# Patient Record
Sex: Female | Born: 1945 | Race: White | Hispanic: No | Marital: Married | Smoking: Current every day smoker
Health system: Southern US, Community
[De-identification: ages and names within clinical notes are randomized; demographics above are authoritative.]

## PROBLEM LIST (undated history)

## (undated) DIAGNOSIS — R569 Unspecified convulsions: Secondary | ICD-10-CM

## (undated) DIAGNOSIS — I252 Old myocardial infarction: Secondary | ICD-10-CM

## (undated) DIAGNOSIS — I10 Essential (primary) hypertension: Secondary | ICD-10-CM

## (undated) DIAGNOSIS — F329 Major depressive disorder, single episode, unspecified: Secondary | ICD-10-CM

## (undated) DIAGNOSIS — I639 Cerebral infarction, unspecified: Secondary | ICD-10-CM

## (undated) DIAGNOSIS — E119 Type 2 diabetes mellitus without complications: Secondary | ICD-10-CM

## (undated) DIAGNOSIS — F32A Depression, unspecified: Secondary | ICD-10-CM

## (undated) DIAGNOSIS — F419 Anxiety disorder, unspecified: Secondary | ICD-10-CM

## (undated) HISTORY — PX: RECTAL SURGERY: SHX760

## (undated) HISTORY — PX: ABDOMINAL SURGERY: SHX537

## (undated) HISTORY — PX: CARDIAC SURGERY: SHX584

## (undated) HISTORY — PX: BLADDER SURGERY: SHX569

## (undated) HISTORY — PX: ABDOMINAL HYSTERECTOMY: SHX81

---

## 2003-07-12 ENCOUNTER — Ambulatory Visit (HOSPITAL_COMMUNITY): Admission: RE | Admit: 2003-07-12 | Discharge: 2003-07-12 | Payer: Self-pay

## 2003-08-11 ENCOUNTER — Inpatient Hospital Stay (HOSPITAL_COMMUNITY): Admission: EM | Admit: 2003-08-11 | Discharge: 2003-08-12 | Payer: Self-pay | Admitting: Psychiatry

## 2003-08-12 ENCOUNTER — Inpatient Hospital Stay (HOSPITAL_COMMUNITY): Admission: EM | Admit: 2003-08-12 | Discharge: 2003-08-13 | Payer: Self-pay | Admitting: Emergency Medicine

## 2003-08-13 ENCOUNTER — Inpatient Hospital Stay (HOSPITAL_COMMUNITY): Admission: EM | Admit: 2003-08-13 | Discharge: 2003-08-18 | Payer: Self-pay | Admitting: Psychiatry

## 2005-05-27 ENCOUNTER — Emergency Department: Payer: Self-pay | Admitting: Emergency Medicine

## 2005-05-27 ENCOUNTER — Other Ambulatory Visit: Payer: Self-pay

## 2007-08-09 ENCOUNTER — Inpatient Hospital Stay: Payer: Self-pay | Admitting: Internal Medicine

## 2007-08-09 ENCOUNTER — Other Ambulatory Visit: Payer: Self-pay

## 2007-09-11 ENCOUNTER — Other Ambulatory Visit: Payer: Self-pay

## 2007-09-11 ENCOUNTER — Inpatient Hospital Stay: Payer: Self-pay | Admitting: Internal Medicine

## 2007-10-01 ENCOUNTER — Other Ambulatory Visit: Payer: Self-pay

## 2007-10-01 ENCOUNTER — Emergency Department: Payer: Self-pay | Admitting: Emergency Medicine

## 2007-10-05 ENCOUNTER — Other Ambulatory Visit: Payer: Self-pay

## 2007-10-05 ENCOUNTER — Emergency Department: Payer: Self-pay | Admitting: Emergency Medicine

## 2007-10-27 ENCOUNTER — Other Ambulatory Visit: Payer: Self-pay

## 2007-10-27 ENCOUNTER — Emergency Department: Payer: Self-pay | Admitting: Emergency Medicine

## 2008-01-27 ENCOUNTER — Inpatient Hospital Stay: Payer: Self-pay | Admitting: Internal Medicine

## 2008-01-27 ENCOUNTER — Other Ambulatory Visit: Payer: Self-pay

## 2008-04-24 ENCOUNTER — Other Ambulatory Visit: Payer: Self-pay

## 2008-04-24 ENCOUNTER — Inpatient Hospital Stay: Payer: Self-pay | Admitting: Internal Medicine

## 2010-12-21 ENCOUNTER — Inpatient Hospital Stay: Payer: Self-pay | Admitting: *Deleted

## 2012-01-12 LAB — COMPREHENSIVE METABOLIC PANEL
Albumin: 3.7 g/dL (ref 3.4–5.0)
Alkaline Phosphatase: 116 U/L (ref 50–136)
Anion Gap: 11 (ref 7–16)
BUN: 15 mg/dL (ref 7–18)
Bilirubin,Total: 0.2 mg/dL (ref 0.2–1.0)
Calcium, Total: 9.1 mg/dL (ref 8.5–10.1)
Co2: 26 mmol/L (ref 21–32)
Creatinine: 0.82 mg/dL (ref 0.60–1.30)
EGFR (African American): >60
EGFR (Non-African Amer.): >60
Glucose: 73 mg/dL (ref 65–99)
Osmolality: 277 (ref 275–301)
Sodium: 139 mmol/L (ref 136–145)
Total Protein: 7.5 g/dL (ref 6.4–8.2)

## 2012-01-12 LAB — CBC
HCT: 40.4 % (ref 35.0–47.0)
MCH: 32 pg (ref 26.0–34.0)
MCHC: 33.3 g/dL (ref 32.0–36.0)
MCV: 96 fL (ref 80–100)
Platelet: 276 10*3/uL (ref 150–440)
RBC: 4.2 10*6/uL (ref 3.80–5.20)
WBC: 8.4 10*3/uL (ref 3.6–11.0)

## 2012-01-12 LAB — CK TOTAL AND CKMB (NOT AT ARMC)
CK, Total: 70 U/L (ref 21–215)
CK-MB: 0.7 ng/mL (ref 0.5–3.6)

## 2012-01-12 LAB — PROTIME-INR
INR: 0.8
Prothrombin Time: 11.4 secs — ABNORMAL LOW (ref 11.5–14.7)

## 2012-01-13 ENCOUNTER — Observation Stay: Payer: Self-pay | Admitting: Internal Medicine

## 2012-01-13 LAB — TROPONIN I
Troponin-I: <0.02 ng/mL
Troponin-I: <0.02 ng/mL

## 2012-01-13 LAB — CK TOTAL AND CKMB (NOT AT ARMC)
CK, Total: 53 U/L (ref 21–215)
CK-MB: 0.8 ng/mL (ref 0.5–3.6)
CK-MB: 1 ng/mL (ref 0.5–3.6)

## 2012-01-13 LAB — LIPID PANEL
HDL Cholesterol: 53 mg/dL (ref 40–60)
Triglycerides: 126 mg/dL (ref 0–200)
VLDL Cholesterol, Calc: 25 mg/dL (ref 5–40)

## 2012-01-13 LAB — MAGNESIUM: Magnesium: 1.3 mg/dL — ABNORMAL LOW

## 2012-01-13 LAB — TSH: Thyroid Stimulating Horm: 3.4 u[IU]/mL

## 2012-01-19 ENCOUNTER — Emergency Department: Payer: Self-pay | Admitting: *Deleted

## 2012-06-09 ENCOUNTER — Inpatient Hospital Stay: Payer: Self-pay | Admitting: Specialist

## 2012-06-09 LAB — CBC
HCT: 41 % (ref 35.0–47.0)
HGB: 13.6 g/dL (ref 12.0–16.0)
MCH: 31.5 pg (ref 26.0–34.0)
RBC: 4.33 10*6/uL (ref 3.80–5.20)

## 2012-06-09 LAB — BASIC METABOLIC PANEL
Anion Gap: 8 (ref 7–16)
Creatinine: 0.78 mg/dL (ref 0.60–1.30)
EGFR (African American): >60
EGFR (Non-African Amer.): >60

## 2012-06-09 LAB — CK TOTAL AND CKMB (NOT AT ARMC): CK, Total: 74 U/L (ref 21–215)

## 2012-06-10 LAB — URINALYSIS, COMPLETE
Glucose,UR: NEGATIVE mg/dL (ref 0–75)
Leukocyte Esterase: NEGATIVE
Nitrite: NEGATIVE
Protein: NEGATIVE
RBC,UR: <1 /HPF (ref 0–5)
Squamous Epithelial: NONE SEEN

## 2012-06-10 LAB — CBC WITH DIFFERENTIAL/PLATELET
Basophil #: 0.1 10*3/uL (ref 0.0–0.1)
Eosinophil %: 1.6 %
HCT: 37.3 % (ref 35.0–47.0)
MCH: 32.2 pg (ref 26.0–34.0)
MCV: 95 fL (ref 80–100)
Monocyte %: 5.6 %
Platelet: 252 10*3/uL (ref 150–440)
RDW: 14.1 % (ref 11.5–14.5)
WBC: 8.6 10*3/uL (ref 3.6–11.0)

## 2012-06-10 LAB — LIPID PANEL
Cholesterol: 197 mg/dL (ref 0–200)
HDL Cholesterol: 47 mg/dL (ref 40–60)
Ldl Cholesterol, Calc: 123 mg/dL — ABNORMAL HIGH (ref 0–100)
Triglycerides: 136 mg/dL (ref 0–200)
VLDL Cholesterol, Calc: 27 mg/dL (ref 5–40)

## 2012-06-10 LAB — TROPONIN I: Troponin-I: <0.02 ng/mL

## 2012-10-10 ENCOUNTER — Emergency Department: Payer: Self-pay

## 2012-10-10 ENCOUNTER — Emergency Department: Payer: Self-pay | Admitting: Emergency Medicine

## 2012-10-10 LAB — URINALYSIS, COMPLETE
Blood: NEGATIVE
Glucose,UR: NEGATIVE mg/dL (ref 0–75)
Nitrite: NEGATIVE
Ph: 6 (ref 4.5–8.0)
Squamous Epithelial: <1

## 2012-10-10 LAB — TROPONIN I: Troponin-I: <0.02 ng/mL

## 2012-10-10 LAB — COMPREHENSIVE METABOLIC PANEL
Albumin: 3.5 g/dL (ref 3.4–5.0)
Bilirubin,Total: 0.1 mg/dL — ABNORMAL LOW (ref 0.2–1.0)
Calcium, Total: 8.7 mg/dL (ref 8.5–10.1)
Co2: 27 mmol/L (ref 21–32)
Creatinine: 0.82 mg/dL (ref 0.60–1.30)
EGFR (African American): >60
EGFR (Non-African Amer.): >60
Osmolality: 276 (ref 275–301)
SGOT(AST): 10 U/L — ABNORMAL LOW (ref 15–37)
Sodium: 138 mmol/L (ref 136–145)

## 2012-10-10 LAB — CBC WITH DIFFERENTIAL/PLATELET
Basophil #: 0.1 10*3/uL (ref 0.0–0.1)
Eosinophil #: 0.1 10*3/uL (ref 0.0–0.7)
Eosinophil %: 1.5 %
HCT: 41.3 % (ref 35.0–47.0)
Lymphocyte #: 3.1 10*3/uL (ref 1.0–3.6)
Lymphocyte %: 38 %
MCH: 30.8 pg (ref 26.0–34.0)
MCV: 95 fL (ref 80–100)
Monocyte #: 0.5 x10 3/mm (ref 0.2–0.9)
Monocyte %: 6.1 %
Neutrophil #: 4.4 10*3/uL (ref 1.4–6.5)
Neutrophil %: 53.7 %
Platelet: 311 10*3/uL (ref 150–440)
RBC: 4.33 10*6/uL (ref 3.80–5.20)
RDW: 14 % (ref 11.5–14.5)
WBC: 8.2 10*3/uL (ref 3.6–11.0)

## 2012-12-20 ENCOUNTER — Observation Stay: Payer: Self-pay | Admitting: Internal Medicine

## 2012-12-20 LAB — CBC
HCT: 37.8 % (ref 35.0–47.0)
HGB: 12.6 g/dL (ref 12.0–16.0)
MCV: 93 fL (ref 80–100)
Platelet: 294 10*3/uL (ref 150–440)
RDW: 14.3 % (ref 11.5–14.5)
WBC: 8.7 10*3/uL (ref 3.6–11.0)

## 2012-12-20 LAB — COMPREHENSIVE METABOLIC PANEL
Alkaline Phosphatase: 131 U/L (ref 50–136)
Anion Gap: 7 (ref 7–16)
Co2: 27 mmol/L (ref 21–32)
Glucose: 124 mg/dL — ABNORMAL HIGH (ref 65–99)
Osmolality: 277 (ref 275–301)
SGPT (ALT): 12 U/L (ref 12–78)
Sodium: 138 mmol/L (ref 136–145)
Total Protein: 6.9 g/dL (ref 6.4–8.2)

## 2012-12-20 LAB — URINALYSIS, COMPLETE
Blood: NEGATIVE
Glucose,UR: NEGATIVE mg/dL (ref 0–75)
Ketone: NEGATIVE
Leukocyte Esterase: NEGATIVE
Ph: 7 (ref 4.5–8.0)
Protein: NEGATIVE
RBC,UR: 2 /HPF (ref 0–5)
WBC UR: 4 /HPF (ref 0–5)

## 2012-12-20 LAB — TROPONIN I: Troponin-I: <0.02 ng/mL

## 2012-12-20 LAB — PROTIME-INR: Prothrombin Time: 11.8 secs (ref 11.5–14.7)

## 2012-12-21 DIAGNOSIS — G459 Transient cerebral ischemic attack, unspecified: Secondary | ICD-10-CM

## 2012-12-21 LAB — LIPID PANEL
Cholesterol: 237 mg/dL — ABNORMAL HIGH (ref 0–200)
Ldl Cholesterol, Calc: 142 mg/dL — ABNORMAL HIGH (ref 0–100)
VLDL Cholesterol, Calc: 42 mg/dL — ABNORMAL HIGH (ref 5–40)

## 2012-12-22 LAB — HEMOGLOBIN A1C: Hemoglobin A1C: 8 % — ABNORMAL HIGH (ref 4.2–6.3)

## 2013-01-10 ENCOUNTER — Observation Stay: Payer: Self-pay | Admitting: Specialist

## 2013-01-10 LAB — COMPREHENSIVE METABOLIC PANEL
Albumin: 3.4 g/dL (ref 3.4–5.0)
Alkaline Phosphatase: 122 U/L (ref 50–136)
Anion Gap: 9 (ref 7–16)
BUN: 14 mg/dL (ref 7–18)
Bilirubin,Total: 0.1 mg/dL — ABNORMAL LOW (ref 0.2–1.0)
Calcium, Total: 8.2 mg/dL — ABNORMAL LOW (ref 8.5–10.1)
Chloride: 105 mmol/L (ref 98–107)
Co2: 23 mmol/L (ref 21–32)
EGFR (African American): >60
EGFR (Non-African Amer.): >60
Glucose: 153 mg/dL — ABNORMAL HIGH (ref 65–99)
Osmolality: 277 (ref 275–301)
Potassium: 3.9 mmol/L (ref 3.5–5.1)
SGPT (ALT): 11 U/L — ABNORMAL LOW (ref 12–78)
Sodium: 137 mmol/L (ref 136–145)
Total Protein: 6.8 g/dL (ref 6.4–8.2)

## 2013-01-10 LAB — CBC
HCT: 36.4 % (ref 35.0–47.0)
HGB: 12.6 g/dL (ref 12.0–16.0)
MCH: 32.7 pg (ref 26.0–34.0)
MCHC: 34.6 g/dL (ref 32.0–36.0)
RBC: 3.85 10*6/uL (ref 3.80–5.20)
RDW: 14 % (ref 11.5–14.5)
WBC: 9.6 10*3/uL (ref 3.6–11.0)

## 2013-01-10 LAB — TROPONIN I: Troponin-I: <0.02 ng/mL

## 2013-01-10 LAB — PROTIME-INR
INR: 0.9
Prothrombin Time: 12.1 secs (ref 11.5–14.7)

## 2013-01-10 LAB — CK TOTAL AND CKMB (NOT AT ARMC): CK, Total: 84 U/L (ref 21–215)

## 2013-01-11 LAB — BASIC METABOLIC PANEL
BUN: 15 mg/dL (ref 7–18)
Calcium, Total: 8.1 mg/dL — ABNORMAL LOW (ref 8.5–10.1)
Chloride: 105 mmol/L (ref 98–107)
Co2: 29 mmol/L (ref 21–32)
Creatinine: 0.92 mg/dL (ref 0.60–1.30)
EGFR (African American): >60
EGFR (Non-African Amer.): >60
Osmolality: 282 (ref 275–301)
Sodium: 141 mmol/L (ref 136–145)

## 2013-01-11 LAB — CBC WITH DIFFERENTIAL/PLATELET
Basophil #: 0.1 10*3/uL (ref 0.0–0.1)
Basophil %: 0.7 %
Eosinophil #: 0.2 10*3/uL (ref 0.0–0.7)
Eosinophil %: 2.6 %
HCT: 36.4 % (ref 35.0–47.0)
HGB: 12.4 g/dL (ref 12.0–16.0)
Lymphocyte #: 4.2 10*3/uL — ABNORMAL HIGH (ref 1.0–3.6)
Lymphocyte %: 50.6 %
MCH: 32.3 pg (ref 26.0–34.0)
MCHC: 33.9 g/dL (ref 32.0–36.0)
Monocyte %: 8.2 %
Neutrophil #: 3.1 10*3/uL (ref 1.4–6.5)
Neutrophil %: 37.9 %
Platelet: 286 10*3/uL (ref 150–440)
RBC: 3.82 10*6/uL (ref 3.80–5.20)

## 2013-01-11 LAB — URINALYSIS, COMPLETE
Bilirubin,UR: NEGATIVE
Ketone: NEGATIVE
Leukocyte Esterase: NEGATIVE
Nitrite: NEGATIVE
Ph: 5 (ref 4.5–8.0)
Protein: NEGATIVE
RBC,UR: 1 /HPF (ref 0–5)
Squamous Epithelial: <1
WBC UR: <1 /HPF (ref 0–5)

## 2013-01-11 LAB — CK TOTAL AND CKMB (NOT AT ARMC): CK-MB: 1.3 ng/mL (ref 0.5–3.6)

## 2013-01-11 LAB — TROPONIN I
Troponin-I: <0.02 ng/mL
Troponin-I: <0.02 ng/mL

## 2013-02-19 ENCOUNTER — Emergency Department: Payer: Self-pay | Admitting: Emergency Medicine

## 2013-02-19 LAB — COMPREHENSIVE METABOLIC PANEL
Albumin: 3.6 g/dL (ref 3.4–5.0)
Alkaline Phosphatase: 158 U/L — ABNORMAL HIGH (ref 50–136)
BUN: 10 mg/dL (ref 7–18)
Bilirubin,Total: 0.3 mg/dL (ref 0.2–1.0)
Co2: 27 mmol/L (ref 21–32)
Creatinine: 0.65 mg/dL (ref 0.60–1.30)
EGFR (African American): >60
EGFR (Non-African Amer.): >60
Osmolality: 273 (ref 275–301)
SGOT(AST): 29 U/L (ref 15–37)
Sodium: 137 mmol/L (ref 136–145)
Total Protein: 7.9 g/dL (ref 6.4–8.2)

## 2013-02-19 LAB — URINALYSIS, COMPLETE
Bilirubin,UR: NEGATIVE
Glucose,UR: NEGATIVE mg/dL (ref 0–75)
Ketone: NEGATIVE
WBC UR: 12 /HPF (ref 0–5)

## 2013-02-19 LAB — CBC
MCHC: 33.8 g/dL (ref 32.0–36.0)
MCV: 94 fL (ref 80–100)
RBC: 4.37 10*6/uL (ref 3.80–5.20)
RDW: 14.6 % — ABNORMAL HIGH (ref 11.5–14.5)
WBC: 8.8 10*3/uL (ref 3.6–11.0)

## 2013-02-19 LAB — TROPONIN I: Troponin-I: <0.02 ng/mL

## 2013-02-21 LAB — URINE CULTURE

## 2013-04-07 ENCOUNTER — Emergency Department: Payer: Self-pay | Admitting: Emergency Medicine

## 2013-04-07 LAB — BASIC METABOLIC PANEL
Anion Gap: 8 (ref 7–16)
BUN: 15 mg/dL (ref 7–18)
Calcium, Total: 9.2 mg/dL (ref 8.5–10.1)
Chloride: 102 mmol/L (ref 98–107)
Creatinine: 0.98 mg/dL (ref 0.60–1.30)
EGFR (African American): >60
EGFR (Non-African Amer.): 59 — ABNORMAL LOW
Glucose: 145 mg/dL — ABNORMAL HIGH (ref 65–99)
Potassium: 4.7 mmol/L (ref 3.5–5.1)

## 2013-04-07 LAB — CBC
HCT: 42.7 % (ref 35.0–47.0)
MCH: 31.9 pg (ref 26.0–34.0)
MCHC: 34 g/dL (ref 32.0–36.0)
MCV: 94 fL (ref 80–100)
Platelet: 269 10*3/uL (ref 150–440)
WBC: 8.1 10*3/uL (ref 3.6–11.0)

## 2013-04-07 LAB — TROPONIN I
Troponin-I: <0.02 ng/mL
Troponin-I: <0.02 ng/mL

## 2013-04-07 LAB — CK TOTAL AND CKMB (NOT AT ARMC): CK-MB: 0.7 ng/mL (ref 0.5–3.6)

## 2013-04-23 LAB — CK TOTAL AND CKMB (NOT AT ARMC)
CK, Total: 47 U/L (ref 21–215)
CK-MB: 0.8 ng/mL (ref 0.5–3.6)
CK-MB: 1.1 ng/mL (ref 0.5–3.6)

## 2013-04-23 LAB — URINALYSIS, COMPLETE
Bilirubin,UR: NEGATIVE
Glucose,UR: NEGATIVE mg/dL (ref 0–75)
Ketone: NEGATIVE
Ph: 7 (ref 4.5–8.0)
Protein: NEGATIVE
Specific Gravity: 1.006 (ref 1.003–1.030)
Squamous Epithelial: <1

## 2013-04-23 LAB — BASIC METABOLIC PANEL
Anion Gap: 7 (ref 7–16)
Chloride: 104 mmol/L (ref 98–107)
Co2: 26 mmol/L (ref 21–32)
Creatinine: 0.87 mg/dL (ref 0.60–1.30)
EGFR (African American): >60
Potassium: 4.4 mmol/L (ref 3.5–5.1)
Sodium: 137 mmol/L (ref 136–145)

## 2013-04-23 LAB — CBC
HCT: 39.7 % (ref 35.0–47.0)
HGB: 13.4 g/dL (ref 12.0–16.0)
MCH: 31.5 pg (ref 26.0–34.0)
MCHC: 33.8 g/dL (ref 32.0–36.0)
Platelet: 272 10*3/uL (ref 150–440)
RBC: 4.25 10*6/uL (ref 3.80–5.20)

## 2013-04-23 LAB — TROPONIN I
Troponin-I: <0.02 ng/mL
Troponin-I: <0.02 ng/mL

## 2013-04-24 LAB — CBC WITH DIFFERENTIAL/PLATELET
Basophil #: 0.1 10*3/uL (ref 0.0–0.1)
Basophil %: 0.9 %
Eosinophil #: 0.1 10*3/uL (ref 0.0–0.7)
HCT: 37.4 % (ref 35.0–47.0)
Lymphocyte #: 2.9 10*3/uL (ref 1.0–3.6)
MCH: 31.5 pg (ref 26.0–34.0)
MCHC: 33.1 g/dL (ref 32.0–36.0)
Monocyte #: 1.1 x10 3/mm — ABNORMAL HIGH (ref 0.2–0.9)
Monocyte %: 8.1 %
Neutrophil #: 9.1 10*3/uL — ABNORMAL HIGH (ref 1.4–6.5)
Neutrophil %: 68.1 %
Platelet: 291 10*3/uL (ref 150–440)
RBC: 3.94 10*6/uL (ref 3.80–5.20)
WBC: 13.4 10*3/uL — ABNORMAL HIGH (ref 3.6–11.0)

## 2013-04-24 LAB — BASIC METABOLIC PANEL
Anion Gap: 6 — ABNORMAL LOW (ref 7–16)
BUN: 20 mg/dL — ABNORMAL HIGH (ref 7–18)
Calcium, Total: 8.4 mg/dL — ABNORMAL LOW (ref 8.5–10.1)
Chloride: 102 mmol/L (ref 98–107)
Co2: 28 mmol/L (ref 21–32)
EGFR (African American): 48 — ABNORMAL LOW
EGFR (Non-African Amer.): 42 — ABNORMAL LOW
Glucose: 164 mg/dL — ABNORMAL HIGH (ref 65–99)
Osmolality: 278 (ref 275–301)
Sodium: 136 mmol/L (ref 136–145)

## 2013-04-24 LAB — CK TOTAL AND CKMB (NOT AT ARMC)
CK, Total: 43 U/L (ref 21–215)
CK-MB: 0.9 ng/mL (ref 0.5–3.6)

## 2013-04-24 LAB — TROPONIN I: Troponin-I: <0.02 ng/mL

## 2013-04-24 LAB — LIPID PANEL
HDL Cholesterol: 43 mg/dL (ref 40–60)
VLDL Cholesterol, Calc: 46 mg/dL — ABNORMAL HIGH (ref 5–40)

## 2013-04-25 ENCOUNTER — Inpatient Hospital Stay: Payer: Self-pay | Admitting: Student

## 2013-04-26 LAB — CBC WITH DIFFERENTIAL/PLATELET
Basophil #: 0.1 10*3/uL (ref 0.0–0.1)
Basophil %: 0.7 %
Eosinophil #: 0.2 10*3/uL (ref 0.0–0.7)
Eosinophil %: 1.8 %
HCT: 38.4 % (ref 35.0–47.0)
HGB: 13 g/dL (ref 12.0–16.0)
Lymphocyte #: 2.5 10*3/uL (ref 1.0–3.6)
MCH: 31.5 pg (ref 26.0–34.0)
MCHC: 33.8 g/dL (ref 32.0–36.0)
MCV: 93 fL (ref 80–100)
Neutrophil #: 6.3 10*3/uL (ref 1.4–6.5)
Neutrophil %: 64.1 %
Platelet: 249 10*3/uL (ref 150–440)
RBC: 4.12 10*6/uL (ref 3.80–5.20)
RDW: 14.1 % (ref 11.5–14.5)
WBC: 9.9 10*3/uL (ref 3.6–11.0)

## 2013-04-26 LAB — URINE CULTURE

## 2013-05-06 ENCOUNTER — Emergency Department: Payer: Self-pay | Admitting: Emergency Medicine

## 2013-05-06 LAB — COMPREHENSIVE METABOLIC PANEL
Alkaline Phosphatase: 140 U/L — ABNORMAL HIGH (ref 50–136)
Bilirubin,Total: 0.2 mg/dL (ref 0.2–1.0)
Calcium, Total: 9.4 mg/dL (ref 8.5–10.1)
Chloride: 103 mmol/L (ref 98–107)
Co2: 29 mmol/L (ref 21–32)
Creatinine: 1.05 mg/dL (ref 0.60–1.30)
EGFR (Non-African Amer.): 54 — ABNORMAL LOW
Glucose: 87 mg/dL (ref 65–99)
Osmolality: 275 (ref 275–301)
Potassium: 3.8 mmol/L (ref 3.5–5.1)
SGOT(AST): 14 U/L — ABNORMAL LOW (ref 15–37)
SGPT (ALT): 15 U/L (ref 12–78)

## 2013-05-06 LAB — CBC
HCT: 37.5 % (ref 35.0–47.0)
HGB: 13 g/dL (ref 12.0–16.0)
Platelet: 369 10*3/uL (ref 150–440)
RBC: 4.02 10*6/uL (ref 3.80–5.20)
WBC: 12.4 10*3/uL — ABNORMAL HIGH (ref 3.6–11.0)

## 2013-05-16 ENCOUNTER — Emergency Department: Payer: Self-pay | Admitting: Emergency Medicine

## 2013-05-16 LAB — TSH: Thyroid Stimulating Horm: 2.98 u[IU]/mL

## 2013-05-16 LAB — URINALYSIS, COMPLETE
Bilirubin,UR: NEGATIVE
Glucose,UR: NEGATIVE mg/dL (ref 0–75)
Nitrite: NEGATIVE
Ph: 5 (ref 4.5–8.0)
RBC,UR: 52 /HPF (ref 0–5)
Squamous Epithelial: 2

## 2013-05-16 LAB — COMPREHENSIVE METABOLIC PANEL
Albumin: 3.7 g/dL (ref 3.4–5.0)
Alkaline Phosphatase: 147 U/L — ABNORMAL HIGH (ref 50–136)
Anion Gap: 9 (ref 7–16)
BUN: 22 mg/dL — ABNORMAL HIGH (ref 7–18)
Bilirubin,Total: 0.2 mg/dL (ref 0.2–1.0)
Calcium, Total: 9.5 mg/dL (ref 8.5–10.1)
Chloride: 101 mmol/L (ref 98–107)
Co2: 26 mmol/L (ref 21–32)
Creatinine: 1.28 mg/dL (ref 0.60–1.30)
EGFR (Non-African Amer.): 43 — ABNORMAL LOW
Glucose: 95 mg/dL (ref 65–99)
SGPT (ALT): 31 U/L (ref 12–78)
Sodium: 136 mmol/L (ref 136–145)
Total Protein: 7.9 g/dL (ref 6.4–8.2)

## 2013-05-16 LAB — MAGNESIUM: Magnesium: 1.1 mg/dL — ABNORMAL LOW

## 2013-05-16 LAB — CBC
MCHC: 34.3 g/dL (ref 32.0–36.0)
MCV: 93 fL (ref 80–100)
RBC: 4.43 10*6/uL (ref 3.80–5.20)
RDW: 14.6 % — ABNORMAL HIGH (ref 11.5–14.5)
WBC: 9.3 10*3/uL (ref 3.6–11.0)

## 2013-05-16 LAB — TROPONIN I: Troponin-I: <0.02 ng/mL

## 2013-05-18 LAB — URINE CULTURE

## 2013-05-21 ENCOUNTER — Emergency Department: Payer: Self-pay | Admitting: Emergency Medicine

## 2013-05-21 LAB — URINALYSIS, COMPLETE
Bilirubin,UR: NEGATIVE
Blood: NEGATIVE
Nitrite: NEGATIVE
Ph: 5 (ref 4.5–8.0)
Specific Gravity: 1.02 (ref 1.003–1.030)
WBC UR: 2 /HPF (ref 0–5)

## 2013-05-21 LAB — COMPREHENSIVE METABOLIC PANEL
Albumin: 3.8 g/dL (ref 3.4–5.0)
Alkaline Phosphatase: 149 U/L — ABNORMAL HIGH (ref 50–136)
Anion Gap: 7 (ref 7–16)
BUN: 19 mg/dL — ABNORMAL HIGH (ref 7–18)
Bilirubin,Total: 0.3 mg/dL (ref 0.2–1.0)
Calcium, Total: 9.2 mg/dL (ref 8.5–10.1)
Chloride: 104 mmol/L (ref 98–107)
Co2: 24 mmol/L (ref 21–32)
Creatinine: 1.18 mg/dL (ref 0.60–1.30)
EGFR (African American): 54 — ABNORMAL LOW
EGFR (Non-African Amer.): 47 — ABNORMAL LOW
Glucose: 92 mg/dL (ref 65–99)
Osmolality: 272 (ref 275–301)
Potassium: 4.6 mmol/L (ref 3.5–5.1)
SGOT(AST): 33 U/L (ref 15–37)
Sodium: 135 mmol/L — ABNORMAL LOW (ref 136–145)
Total Protein: 8 g/dL (ref 6.4–8.2)

## 2013-05-21 LAB — CBC
HCT: 43.4 % (ref 35.0–47.0)
HGB: 14.5 g/dL (ref 12.0–16.0)
MCHC: 33.5 g/dL (ref 32.0–36.0)
Platelet: 306 10*3/uL (ref 150–440)
RDW: 14.3 % (ref 11.5–14.5)
WBC: 8.7 10*3/uL (ref 3.6–11.0)

## 2014-01-13 LAB — COMPREHENSIVE METABOLIC PANEL
ALK PHOS: 150 U/L — AB
ALT: 16 U/L (ref 12–78)
Albumin: 3.3 g/dL — ABNORMAL LOW (ref 3.4–5.0)
Anion Gap: 5 — ABNORMAL LOW (ref 7–16)
BILIRUBIN TOTAL: 0.3 mg/dL (ref 0.2–1.0)
BUN: 18 mg/dL (ref 7–18)
CALCIUM: 8.7 mg/dL (ref 8.5–10.1)
CREATININE: 1.03 mg/dL (ref 0.60–1.30)
Chloride: 105 mmol/L (ref 98–107)
Co2: 28 mmol/L (ref 21–32)
EGFR (Non-African Amer.): 55 — ABNORMAL LOW
Glucose: 106 mg/dL — ABNORMAL HIGH (ref 65–99)
Osmolality: 278 (ref 275–301)
POTASSIUM: 4.3 mmol/L (ref 3.5–5.1)
SGOT(AST): 18 U/L (ref 15–37)
SODIUM: 138 mmol/L (ref 136–145)
Total Protein: 7.2 g/dL (ref 6.4–8.2)

## 2014-01-13 LAB — URINALYSIS, COMPLETE
Bilirubin,UR: NEGATIVE
Blood: NEGATIVE
GLUCOSE, UR: NEGATIVE mg/dL (ref 0–75)
Ketone: NEGATIVE
Leukocyte Esterase: NEGATIVE
NITRITE: NEGATIVE
Ph: 7 (ref 4.5–8.0)
Protein: NEGATIVE
Specific Gravity: 1.005 (ref 1.003–1.030)
Squamous Epithelial: <1
WBC UR: 3 /HPF (ref 0–5)

## 2014-01-13 LAB — CBC
HCT: 36.4 % (ref 35.0–47.0)
HGB: 11.8 g/dL — ABNORMAL LOW (ref 12.0–16.0)
MCH: 30.2 pg (ref 26.0–34.0)
MCHC: 32.5 g/dL (ref 32.0–36.0)
MCV: 93 fL (ref 80–100)
Platelet: 287 10*3/uL (ref 150–440)
RBC: 3.91 10*6/uL (ref 3.80–5.20)
RDW: 14.3 % (ref 11.5–14.5)
WBC: 8.4 10*3/uL (ref 3.6–11.0)

## 2014-01-13 LAB — TROPONIN I

## 2014-01-14 ENCOUNTER — Observation Stay: Payer: Self-pay | Admitting: Student

## 2014-01-14 LAB — TROPONIN I: Troponin-I: <0.02 ng/mL

## 2014-08-18 ENCOUNTER — Inpatient Hospital Stay (HOSPITAL_COMMUNITY)
Admission: EM | Admit: 2014-08-18 | Discharge: 2014-08-20 | DRG: 312 | Disposition: A | Discharge status: Home / Self Care | Payer: Medicare HMO | Attending: Internal Medicine | Admitting: Internal Medicine

## 2014-08-18 ENCOUNTER — Emergency Department (HOSPITAL_COMMUNITY): Payer: Medicare HMO

## 2014-08-18 ENCOUNTER — Encounter (HOSPITAL_COMMUNITY): Payer: Self-pay | Admitting: *Deleted

## 2014-08-18 DIAGNOSIS — Z881 Allergy status to other antibiotic agents status: Secondary | ICD-10-CM

## 2014-08-18 DIAGNOSIS — E119 Type 2 diabetes mellitus without complications: Secondary | ICD-10-CM

## 2014-08-18 DIAGNOSIS — Z791 Long term (current) use of non-steroidal anti-inflammatories (NSAID): Secondary | ICD-10-CM

## 2014-08-18 DIAGNOSIS — R519 Headache, unspecified: Secondary | ICD-10-CM | POA: Diagnosis present

## 2014-08-18 DIAGNOSIS — I161 Hypertensive emergency: Secondary | ICD-10-CM | POA: Diagnosis present

## 2014-08-18 DIAGNOSIS — R55 Syncope and collapse: Secondary | ICD-10-CM | POA: Diagnosis not present

## 2014-08-18 DIAGNOSIS — G43109 Migraine with aura, not intractable, without status migrainosus: Secondary | ICD-10-CM

## 2014-08-18 DIAGNOSIS — I252 Old myocardial infarction: Secondary | ICD-10-CM

## 2014-08-18 DIAGNOSIS — I159 Secondary hypertension, unspecified: Secondary | ICD-10-CM

## 2014-08-18 DIAGNOSIS — N390 Urinary tract infection, site not specified: Secondary | ICD-10-CM

## 2014-08-18 DIAGNOSIS — E860 Dehydration: Secondary | ICD-10-CM | POA: Diagnosis present

## 2014-08-18 DIAGNOSIS — R296 Repeated falls: Secondary | ICD-10-CM | POA: Diagnosis present

## 2014-08-18 DIAGNOSIS — Z91018 Allergy to other foods: Secondary | ICD-10-CM

## 2014-08-18 DIAGNOSIS — Z7982 Long term (current) use of aspirin: Secondary | ICD-10-CM

## 2014-08-18 DIAGNOSIS — R569 Unspecified convulsions: Secondary | ICD-10-CM | POA: Diagnosis present

## 2014-08-18 DIAGNOSIS — F17210 Nicotine dependence, cigarettes, uncomplicated: Secondary | ICD-10-CM | POA: Diagnosis present

## 2014-08-18 DIAGNOSIS — N179 Acute kidney failure, unspecified: Secondary | ICD-10-CM | POA: Diagnosis present

## 2014-08-18 DIAGNOSIS — Z90710 Acquired absence of both cervix and uterus: Secondary | ICD-10-CM

## 2014-08-18 DIAGNOSIS — Z79899 Other long term (current) drug therapy: Secondary | ICD-10-CM

## 2014-08-18 DIAGNOSIS — R52 Pain, unspecified: Secondary | ICD-10-CM

## 2014-08-18 DIAGNOSIS — I10 Essential (primary) hypertension: Secondary | ICD-10-CM | POA: Diagnosis present

## 2014-08-18 DIAGNOSIS — Z8673 Personal history of transient ischemic attack (TIA), and cerebral infarction without residual deficits: Secondary | ICD-10-CM

## 2014-08-18 DIAGNOSIS — R51 Headache: Secondary | ICD-10-CM | POA: Diagnosis present

## 2014-08-18 DIAGNOSIS — Z87440 Personal history of urinary (tract) infections: Secondary | ICD-10-CM

## 2014-08-18 DIAGNOSIS — Z720 Tobacco use: Secondary | ICD-10-CM | POA: Diagnosis present

## 2014-08-18 HISTORY — DX: Cerebral infarction, unspecified: I63.9

## 2014-08-18 HISTORY — DX: Type 2 diabetes mellitus without complications: E11.9

## 2014-08-18 HISTORY — DX: Unspecified convulsions: R56.9

## 2014-08-18 HISTORY — DX: Old myocardial infarction: I25.2

## 2014-08-18 HISTORY — DX: Essential (primary) hypertension: I10

## 2014-08-18 LAB — COMPREHENSIVE METABOLIC PANEL
ALBUMIN: 3.5 g/dL (ref 3.5–5.2)
ALT: 11 U/L (ref 0–35)
AST: 17 U/L (ref 0–37)
Alkaline Phosphatase: 114 U/L (ref 39–117)
Anion gap: 14 (ref 5–15)
BUN: 14 mg/dL (ref 6–23)
CALCIUM: 9.1 mg/dL (ref 8.4–10.5)
CO2: 24 meq/L (ref 19–32)
CREATININE: 1.15 mg/dL — AB (ref 0.50–1.10)
Chloride: 100 mEq/L (ref 96–112)
GFR calc Af Amer: 55 mL/min — ABNORMAL LOW (ref >90)
GFR, EST NON AFRICAN AMERICAN: 47 mL/min — AB (ref >90)
Glucose, Bld: 88 mg/dL (ref 70–99)
Potassium: 4.3 mEq/L (ref 3.7–5.3)
SODIUM: 138 meq/L (ref 137–147)
Total Bilirubin: <0.2 mg/dL — ABNORMAL LOW (ref 0.3–1.2)
Total Protein: 7.2 g/dL (ref 6.0–8.3)

## 2014-08-18 LAB — CBC WITH DIFFERENTIAL/PLATELET
BASOS ABS: 0 10*3/uL (ref 0.0–0.1)
BASOS PCT: 1 % (ref 0–1)
EOS ABS: 0.1 10*3/uL (ref 0.0–0.7)
Eosinophils Relative: 2 % (ref 0–5)
HCT: 35.7 % — ABNORMAL LOW (ref 36.0–46.0)
Hemoglobin: 11.6 g/dL — ABNORMAL LOW (ref 12.0–15.0)
LYMPHS PCT: 39 % (ref 12–46)
Lymphs Abs: 2.8 10*3/uL (ref 0.7–4.0)
MCH: 30.4 pg (ref 26.0–34.0)
MCHC: 32.5 g/dL (ref 30.0–36.0)
MCV: 93.5 fL (ref 78.0–100.0)
MONO ABS: 0.7 10*3/uL (ref 0.1–1.0)
Monocytes Relative: 9 % (ref 3–12)
Neutro Abs: 3.6 10*3/uL (ref 1.7–7.7)
Neutrophils Relative %: 49 % (ref 43–77)
PLATELETS: 277 10*3/uL (ref 150–400)
RBC: 3.82 MIL/uL — ABNORMAL LOW (ref 3.87–5.11)
RDW: 15 % (ref 11.5–15.5)
WBC: 7.3 10*3/uL (ref 4.0–10.5)

## 2014-08-18 LAB — URINALYSIS, ROUTINE W REFLEX MICROSCOPIC
BILIRUBIN URINE: NEGATIVE
Glucose, UA: NEGATIVE mg/dL
Hgb urine dipstick: NEGATIVE
Ketones, ur: NEGATIVE mg/dL
Nitrite: NEGATIVE
Protein, ur: NEGATIVE mg/dL
Specific Gravity, Urine: 1.005 (ref 1.005–1.030)
UROBILINOGEN UA: 0.2 mg/dL (ref 0.0–1.0)
pH: 7 (ref 5.0–8.0)

## 2014-08-18 LAB — URINE MICROSCOPIC-ADD ON

## 2014-08-18 LAB — GLUCOSE, CAPILLARY: Glucose-Capillary: 106 mg/dL — ABNORMAL HIGH (ref 70–99)

## 2014-08-18 LAB — I-STAT TROPONIN, ED: TROPONIN I, POC: 0 ng/mL (ref 0.00–0.08)

## 2014-08-18 MED ORDER — SODIUM CHLORIDE 0.9 % IJ SOLN
3.0000 mL | Freq: Two times a day (BID) | INTRAMUSCULAR | Status: DC
  Administered 2014-08-18 – 2014-08-19 (×3): 3 mL via INTRAVENOUS

## 2014-08-18 MED ORDER — DIPHENHYDRAMINE HCL 25 MG PO TABS
50.0000 mg | ORAL_TABLET | Freq: Every day | ORAL | Status: DC | PRN
  Filled 2014-08-18: qty 2

## 2014-08-18 MED ORDER — AMLODIPINE BESYLATE 5 MG PO TABS
5.0000 mg | ORAL_TABLET | Freq: Once | ORAL | Status: AC
  Administered 2014-08-18: 5 mg via ORAL
  Filled 2014-08-18: qty 1

## 2014-08-18 MED ORDER — INSULIN ASPART 100 UNIT/ML ~~LOC~~ SOLN: 0.0000 [IU] | Freq: Every day | SUBCUTANEOUS | Status: DC

## 2014-08-18 MED ORDER — ASPIRIN EC 81 MG PO TBEC
81.0000 mg | DELAYED_RELEASE_TABLET | Freq: Two times a day (BID) | ORAL | Status: DC
  Administered 2014-08-18 – 2014-08-20 (×4): 81 mg via ORAL
  Filled 2014-08-18 (×4): qty 1

## 2014-08-18 MED ORDER — ACETAMINOPHEN 650 MG RE SUPP: 650.0000 mg | Freq: Four times a day (QID) | RECTAL | Status: DC | PRN

## 2014-08-18 MED ORDER — HEPARIN SODIUM (PORCINE) 5000 UNIT/ML IJ SOLN
5000.0000 [IU] | Freq: Three times a day (TID) | INTRAMUSCULAR | Status: DC
  Administered 2014-08-18 – 2014-08-20 (×5): 5000 [IU] via SUBCUTANEOUS
  Filled 2014-08-18 (×5): qty 1

## 2014-08-18 MED ORDER — ACETAMINOPHEN 325 MG PO TABS
650.0000 mg | ORAL_TABLET | Freq: Once | ORAL | Status: DC
  Filled 2014-08-18 (×2): qty 2

## 2014-08-18 MED ORDER — AMLODIPINE BESYLATE 5 MG PO TABS
5.0000 mg | ORAL_TABLET | Freq: Every day | ORAL | Status: DC
  Administered 2014-08-18 – 2014-08-20 (×3): 5 mg via ORAL
  Filled 2014-08-18 (×3): qty 1

## 2014-08-18 MED ORDER — DIPHENHYDRAMINE HCL 50 MG/ML IJ SOLN
12.5000 mg | Freq: Once | INTRAMUSCULAR | Status: AC
  Administered 2014-08-18: 12.5 mg via INTRAVENOUS
  Filled 2014-08-18: qty 1

## 2014-08-18 MED ORDER — SODIUM CHLORIDE 0.9 % IV SOLN: INTRAVENOUS | Status: DC

## 2014-08-18 MED ORDER — DEXTROSE 5 % IV SOLN
1.0000 g | INTRAVENOUS | Status: DC
  Administered 2014-08-19: 1 g via INTRAVENOUS
  Filled 2014-08-18 (×2): qty 10

## 2014-08-18 MED ORDER — KETOROLAC TROMETHAMINE 15 MG/ML IJ SOLN
15.0000 mg | Freq: Once | INTRAMUSCULAR | Status: AC
  Administered 2014-08-18: 15 mg via INTRAVENOUS
  Filled 2014-08-18: qty 1

## 2014-08-18 MED ORDER — ALPRAZOLAM 0.25 MG PO TABS
0.2500 mg | ORAL_TABLET | Freq: Every day | ORAL | Status: DC | PRN
  Administered 2014-08-18: 0.25 mg via ORAL
  Filled 2014-08-18: qty 1

## 2014-08-18 MED ORDER — METFORMIN HCL 500 MG PO TABS: 1000.0000 mg | ORAL_TABLET | Freq: Two times a day (BID) | ORAL | Status: DC

## 2014-08-18 MED ORDER — PANTOPRAZOLE SODIUM 40 MG PO TBEC
40.0000 mg | DELAYED_RELEASE_TABLET | Freq: Every day | ORAL | Status: DC
  Administered 2014-08-18 – 2014-08-20 (×3): 40 mg via ORAL
  Filled 2014-08-18 (×3): qty 1

## 2014-08-18 MED ORDER — AMITRIPTYLINE HCL 25 MG PO TABS: 75.0000 mg | ORAL_TABLET | Freq: Every day | ORAL | Status: DC

## 2014-08-18 MED ORDER — HYDRALAZINE HCL 20 MG/ML IJ SOLN
10.0000 mg | Freq: Once | INTRAMUSCULAR | Status: AC
  Administered 2014-08-18: 10 mg via INTRAVENOUS

## 2014-08-18 MED ORDER — MORPHINE SULFATE 2 MG/ML IJ SOLN: 2.0000 mg | INTRAMUSCULAR | Status: DC | PRN

## 2014-08-18 MED ORDER — AMITRIPTYLINE HCL 25 MG PO TABS
75.0000 mg | ORAL_TABLET | Freq: Every day | ORAL | Status: DC
  Administered 2014-08-18 – 2014-08-19 (×2): 75 mg via ORAL
  Filled 2014-08-18 (×2): qty 3

## 2014-08-18 MED ORDER — ONDANSETRON HCL 4 MG PO TABS: 4.0000 mg | ORAL_TABLET | Freq: Four times a day (QID) | ORAL | Status: DC | PRN

## 2014-08-18 MED ORDER — ONDANSETRON HCL 4 MG/2ML IJ SOLN: 4.0000 mg | Freq: Three times a day (TID) | INTRAMUSCULAR | Status: DC | PRN

## 2014-08-18 MED ORDER — METFORMIN HCL 500 MG PO TABS
1000.0000 mg | ORAL_TABLET | Freq: Two times a day (BID) | ORAL | Status: DC
  Administered 2014-08-19 – 2014-08-20 (×3): 1000 mg via ORAL
  Filled 2014-08-18 (×3): qty 2

## 2014-08-18 MED ORDER — INSULIN ASPART 100 UNIT/ML ~~LOC~~ SOLN
0.0000 [IU] | Freq: Three times a day (TID) | SUBCUTANEOUS | Status: DC
  Administered 2014-08-19: 3 [IU] via SUBCUTANEOUS
  Administered 2014-08-19 – 2014-08-20 (×2): 2 [IU] via SUBCUTANEOUS

## 2014-08-18 MED ORDER — HYDRALAZINE HCL 20 MG/ML IJ SOLN
10.0000 mg | INTRAMUSCULAR | Status: DC | PRN
  Administered 2014-08-18: 10 mg via INTRAVENOUS
  Filled 2014-08-18 (×2): qty 1

## 2014-08-18 MED ORDER — HYDROMORPHONE HCL 1 MG/ML IJ SOLN
0.5000 mg | INTRAMUSCULAR | Status: DC | PRN
  Administered 2014-08-18 – 2014-08-19 (×3): 0.5 mg via INTRAVENOUS
  Filled 2014-08-18 (×3): qty 1

## 2014-08-18 MED ORDER — FLUOXETINE HCL 20 MG PO CAPS
40.0000 mg | ORAL_CAPSULE | Freq: Every day | ORAL | Status: DC
  Administered 2014-08-19 – 2014-08-20 (×2): 40 mg via ORAL
  Filled 2014-08-18: qty 4
  Filled 2014-08-18: qty 2

## 2014-08-18 MED ORDER — SIMVASTATIN 20 MG PO TABS
20.0000 mg | ORAL_TABLET | Freq: Every day | ORAL | Status: DC
  Administered 2014-08-18 – 2014-08-19 (×2): 20 mg via ORAL
  Filled 2014-08-18 (×2): qty 1

## 2014-08-18 MED ORDER — METOCLOPRAMIDE HCL 5 MG/ML IJ SOLN
5.0000 mg | Freq: Once | INTRAMUSCULAR | Status: AC
  Administered 2014-08-18: 5 mg via INTRAVENOUS
  Filled 2014-08-18: qty 2

## 2014-08-18 MED ORDER — ACETAMINOPHEN 325 MG PO TABS
650.0000 mg | ORAL_TABLET | Freq: Four times a day (QID) | ORAL | Status: DC | PRN
  Administered 2014-08-18 – 2014-08-19 (×2): 650 mg via ORAL
  Filled 2014-08-18: qty 2

## 2014-08-18 MED ORDER — SODIUM CHLORIDE 0.9 % IV BOLUS (SEPSIS)
1000.0000 mL | Freq: Once | INTRAVENOUS | Status: AC
  Administered 2014-08-18: 1000 mL via INTRAVENOUS

## 2014-08-18 MED ORDER — FLUOXETINE HCL 40 MG PO CAPS: 40.0000 mg | ORAL_CAPSULE | Freq: Every day | ORAL | Status: DC

## 2014-08-18 MED ORDER — ONDANSETRON HCL 4 MG/2ML IJ SOLN
4.0000 mg | Freq: Four times a day (QID) | INTRAMUSCULAR | Status: DC | PRN
  Administered 2014-08-18: 4 mg via INTRAVENOUS
  Filled 2014-08-18: qty 2

## 2014-08-18 MED ORDER — DEXTROSE 5 % IV SOLN
1.0000 g | Freq: Once | INTRAVENOUS | Status: AC
  Administered 2014-08-18: 1 g via INTRAVENOUS
  Filled 2014-08-18: qty 10

## 2014-08-18 NOTE — H&P (Addendum)
[facility] History and Physical  Jill Durham:[id number] DOB: 1945/12/29 DOA: 08/18/2014  Referring physician: Emergency department PCP: Pcp Not In System PCP in [place] Specialists:   Chief Complaint: Syncope  HPI: Jill Durham is a 68 y.o. female  With a hx of DM, htn, documented hx of seizures who presents to the ED with recurrent syncope. Pt notes feeling "warm" prior to episode. Pt also notes having a headache that radiates from the back of the neck to across the forehead. In the ED, pt noted to have unremarkable head CT. Pt notes prior hx of seizures, last episode was around 7472yrs ago. Currently not on any AED's. On recent syncopal episode, pt denies sx suggestive of post-ictal state. Pt also noted to have UA suggestive of UTI, started on rocephin.  Of note, pt states being in much stress in her life. She later states prior episodes of syncope seemed to correspond to periods of high stress.  Review of Systems:  Per above, the remainder of the 10pt ros reviewed and are neg  Past Medical History  Diagnosis Date  . Diabetes mellitus without complication   . MI, old   . Seizures   . Hypertension   . Stroke     tia   Past Surgical History  Procedure Laterality Date  . Abdominal surgery    . Bladder surgery    . Rectal surgery    . Abdominal hysterectomy      partial   Social History:  reports that she has been smoking Cigarettes.  She has been smoking about 1.00 pack per day. She does not have any smokeless tobacco history on file. She reports that she does not drink alcohol or use illicit drugs.  where does patient live--home, ALF, SNF? and with whom if at home?  Can patient participate in ADLs?  Allergies  Allergen Reactions  . Food Swelling    [place] food= swelling of tongue and face  . Erythromycin Nausea And Vomiting    No family history on file. reviewed and is noncontributory to this case (be sure to complete)  Prior to Admission  medications   Medication Sig Start Date End Date Taking? Authorizing Provider  ALPRAZolam Prudy Feeler(XANAX) 0.25 MG tablet Take 0.25 mg by mouth daily as needed for anxiety.   Yes Historical Provider, MD  amitriptyline (ELAVIL) 75 MG tablet Take 75 mg by mouth at bedtime.   Yes Historical Provider, MD  amLODipine (NORVASC) 5 MG tablet Take 5 mg by mouth daily.   Yes Historical Provider, MD  aspirin EC 81 MG tablet Take 81 mg by mouth 2 (two) times daily.   Yes Historical Provider, MD  diphenhydrAMINE (BENADRYL) 25 MG tablet Take 50 mg by mouth daily as needed (for emergency purpose).   Yes Historical Provider, MD  EPIPEN 2-PAK 0.3 MG/0.3ML SOAJ injection  06/18/14  Yes Historical Provider, MD  FLUoxetine (PROZAC) 40 MG capsule Take 40 mg by mouth daily.   Yes Historical Provider, MD  ibuprofen (ADVIL,MOTRIN) 200 MG tablet Take 600 mg by mouth every 6 (six) hours as needed for mild pain.   Yes Historical Provider, MD  metFORMIN (GLUCOPHAGE) 1000 MG tablet Take 1,000 mg by mouth 2 (two) times daily with a meal.   Yes Historical Provider, MD  Multiple Vitamins-Minerals (ADULT GUMMY) CHEW Chew 1 tablet by mouth daily.   Yes Historical Provider, MD  nortriptyline (PAMELOR) 75 MG capsule Take 75 mg by mouth at bedtime.   Yes Historical Provider, MD  omeprazole (PRILOSEC) 40 MG capsule Take 40 mg by mouth 2 (two) times daily.   Yes Historical Provider, MD  simvastatin (ZOCOR) 20 MG tablet Take 20 mg by mouth at bedtime.   Yes Historical Provider, MD   Physical Exam: Filed Vitals:   08/18/14 1416 08/18/14 1416 08/18/14 1445  BP: 178/80 178/80 183/89  Pulse: 79 76 77  Temp:  98.2 F (36.8 C)   TempSrc:  Oral   Resp:  16 18  Height:  5' 5.5" (1.664 m)   Weight:  69.4 kg (153 lb)   SpO2: 98% 98% 98%     General:  Awake, in nad  Eyes: PERRL B   ENT: membranes moist, dentition fair  Neck: trachea midline, neck supple  Cardiovascular: regular, s1, s2  Respiratory: normal resp effort, no  wheezing  Abdomen: soft,nondistended  Skin: normal skin turgor, no abnormal skin lesions seen  Musculoskeletal: perfused, no clubbing  Psychiatric: mood/affect normal//no auditory/visual hallucinations  Neurologic: cn2-12 grossly intact/strength/sensation intact  Labs on Admission:  Basic Metabolic Panel:  Recent Labs Lab 08/18/14 1448  NA 138  K 4.3  CL 100  CO2 24  GLUCOSE 88  BUN 14  CREATININE 1.15*  CALCIUM 9.1   Liver Function Tests:  Recent Labs Lab 08/18/14 1448  AST 17  ALT 11  ALKPHOS 114  BILITOT <0.2*  PROT 7.2  ALBUMIN 3.5   No results for input(s): LIPASE, AMYLASE in the last 168 hours. No results for input(s): AMMONIA in the last 168 hours. CBC:  Recent Labs Lab 08/18/14 1448  WBC 7.3  NEUTROABS 3.6  HGB 11.6*  HCT 35.7*  MCV 93.5  PLT 277   Cardiac Enzymes: No results for input(s): CKTOTAL, CKMB, CKMBINDEX, TROPONINI in the last 168 hours.  BNP (last 3 results) No results for input(s): PROBNP in the last 8760 hours. CBG: No results for input(s): GLUCAP in the last 168 hours.  Radiological Exams on Admission: Dg Chest 2 View  08/18/2014   CLINICAL DATA:  Syncopal episode today. Low back and left hip pain. Initial encounter.  EXAM: CHEST  2 VIEW  COMPARISON:  None.  FINDINGS: The heart size and mediastinal contours are normal status post median sternotomy. There is aortic atherosclerosis. There is probable pleural parenchymal scarring in the lower left hemithorax. The right lung is clear. No significant pleural effusion is identified. There are degenerative changes throughout the spine. No acute osseous findings are evident.  IMPRESSION: Probable pleural parenchymal scarring in the left hemithorax status post median sternotomy. No acute findings suspected.   Electronically Signed   By: Roxy HorsemanBill  Veazey M.D.   On: 08/18/2014 15:53   Dg Lumbar Spine Complete  08/18/2014   CLINICAL DATA:  Syncopal episode today now with left hip and low back  pain. ; history of previous CVA, seizure activity, diabetes, and MI  EXAM: LUMBAR SPINE - COMPLETE 4+ VIEW  COMPARISON:  None.  FINDINGS: The lumbar vertebral bodies are preserved in height. There is gentle levoscoliosis of the mid lumbar spine. There is disc space narrowing from L2-3 through L4-5. There is no spondylolisthesis. There is mild facet joint hypertrophy at L4-5 and at L5-S1 the pedicles and transverse processes are intact. The observed portions of the sacrum are unremarkable.  IMPRESSION: There is degenerative disc disease from L2-3 through L4-5. There is no acute compression fracture.   Electronically Signed   By: David  Swazil[place]Jordan   On: 08/18/2014 15:55   Dg Hip Complete Left  08/18/2014  CLINICAL DATA:  Syncope.Left hip pain.  EXAM: LEFT HIP - COMPLETE 2+ VIEW  COMPARISON:  None  FINDINGS: Both hips are located. There is no acute fracture or subluxation. Mild osteoarthritis is noted involving both hips.  IMPRESSION: 1. No acute findings. 2. Mild bilateral hip osteoarthritis.   Electronically Signed   By: Signa Kell M.D.   On: 08/18/2014 16:02   Ct Head Wo Contrast  08/18/2014   CLINICAL DATA:  Blurred vision.  Syncope.  EXAM: CT HEAD WITHOUT CONTRAST  TECHNIQUE: Contiguous axial images were obtained from the base of the skull through the vertex without intravenous contrast.  COMPARISON:  None.  FINDINGS: No acute intracranial hemorrhage. No focal mass lesion. No CT evidence of acute infarction. No midline shift or mass effect. No hydrocephalus. Basilar cisterns are patent.  There is extensive periventricular and subcortical white matter hypodensities. There is a lacunar infarction within the right internal capsule.  Paranasal sinuses and  mastoid air cells are clear.  IMPRESSION: 1. No acute intracranial findings. 2. Extensive white matter microvascular disease. 3. 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   Electronically Signed   By: Genevive Bi M.D.   On:  08/18/2014 16:38    Assessment/Plan Principal Problem:   Syncope Active Problems:   Headache   Diabetes type 2, controlled   Hypertensive emergency   1. Syncope 1. Unclear etiiology 2. Not orthostatic 3. Question vasovagal vs syncope related to HTN emergency 4. Will check 2d echo 5. Admit to med-tele 2. Headache 1. Photophobia, phonophobia 2. Does feel nauseated 3. Head CT unremarkable 4. Question migraine? Will give reglan, benadryl, and toradol 3. DM 1. SSI 2. Cont metformin 4. Hypertensive emergency 1. Cont home meds 2. Add PRN coverage with hydralazine 5. DVT prophylaxis 1. Heparin subQ 6. UTI 1. Check urine cx 2. On empiric rocephin  Code Status: Full (must indicate code status--if unknown or must be presumed, indicate so) Family Communication: Pt and family in room (indicate person spoken with, if applicable, with phone number if by telephone) Disposition Plan: Pending (indicate anticipated LOS)  Time spent:  CHIU, STEPHEN K [facility] Pager [phone]  If 7PM-7AM, please contact night-coverage [email or web address] Password [facility] 08/18/2014, 5:37 PM

## 2014-08-18 NOTE — Progress Notes (Signed)
ANTIBIOTIC CONSULT NOTE - INITIAL  Pharmacy Consult for Ceftriaxone Indication: UTI  Allergies  Allergen Reactions  . Food Swelling    [place] food= swelling of tongue and face  . Erythromycin Nausea And Vomiting    Patient Measurements: Height: 5' 5.5" (166.4 cm) Weight: 153 lb (69.4 kg) IBW/kg (Calculated) : 58.15  Vital Signs: Temp: 98.2 F (36.8 C) (11/16 1416) Temp Source: Oral (11/16 1416) BP: 183/89 mmHg (11/16 1445) Pulse Rate: 77 (11/16 1445) Intake/Output from previous day:   Intake/Output from this shift:    Labs:  Recent Labs  08/18/14 1448  WBC 7.3  HGB 11.6*  PLT 277  CREATININE 1.15*   Estimated Creatinine Clearance: 41.8 mL/min (by C-G formula based on Cr of 1.15). No results for input(s): VANCOTROUGH, VANCOPEAK, VANCORANDOM, GENTTROUGH, GENTPEAK, GENTRANDOM, TOBRATROUGH, TOBRAPEAK, TOBRARND, AMIKACINPEAK, AMIKACINTROU, AMIKACIN in the last 72 hours.   Microbiology: No results found for this or any previous visit (from the past 720 hour(s)).  Medical History: Past Medical History  Diagnosis Date  . Diabetes mellitus without complication   . MI, old   . Seizures   . Hypertension   . Stroke     tia    Medications:  Scheduled:  . acetaminophen  650 mg Oral Once  . amitriptyline  75 mg Oral QHS  . amLODipine  5 mg Oral Daily  . aspirin EC  81 mg Oral BID  . diphenhydrAMINE  12.5 mg Intravenous Once  . FLUoxetine  40 mg Oral Daily  . heparin  5,000 Units Subcutaneous 3 times per day  . ketorolac  15 mg Intravenous Once  . metFORMIN  1,000 mg Oral BID WC  . metoCLOPramide (REGLAN) injection  5 mg Intravenous Once  . pantoprazole  40 mg Oral Daily  . simvastatin  20 mg Oral QHS  . sodium chloride  3 mL Intravenous Q12H   Assessment: 68 yo f who presented to the ED on 11/16 s/p syncopal episode in PCP's office.  Patient has been experiencing diarrhea and syncopal episodes x 1 month.  Pharmacy is consulted to dose ceftriaxone for UTI.   Patient has already been ordered ceftriaxone 1 gm x 1 in the ED.  Wbc 7.3, tmax 98.2, SCr 1.15, CrCl ~42 ml/min.  Goal of Therapy:  Eradication of infection  Plan:  Ceftriaxone 1 gm IV q24hrs Monitor CBC, C&S, clinical course  Pharmacy will sign off since no dose adjustments are required for renal dysfunction.  Please re-consult if needed. Thank you!  Quintavious Rinck L. Roseanne RenoStewart, PharmD Clinical Pharmacy Resident Pager: [phone] 08/18/2014 5:44 PM

## 2014-08-18 NOTE — ED Provider Notes (Signed)
CSN: [id number]     Arrival date & time 08/18/14  1355 History   First MD Initiated Contact with Patient 08/18/14 1403     Chief Complaint  Patient presents with  . Loss of Consciousness  . Blurred Vision  . Diarrhea     (Consider location/radiation/quality/duration/timing/severity/associated sxs/prior Treatment) HPI Jill Durham is a 68 y.o. female with history of CVA, hypertension, seizures, stroke, diabetes, who presents to ED with complaint of a [facility]opal episode [facility]. Patient states she was on in for regular visit, while talking to the physician in the chair, patient states she began feeling lightheaded, and states she had a syncopal episode. According to patient's husband was in the room, patient screamed out "out of field good" and then she lost consciousness for approximately 30 seconds. Patient was lowered down to the ground. When blood pressure was checked, patient was found to be hypertensive. She states she did not take her medications this morning. She also states she has not been drinking enough water. She feels like she may be dehydrated.patient's blood pressure was rechecked and found to be 200 systolic, patient started complaining of blurred vision at that time. Patient was transported to here. Currently patient reports headache. She states she hit her head on the bathtub 2 days ago after falling. States at that time her legs "gave out." She denies losing consciousness at that time. She reports more frequent falls over the last year, she states she has [facility] and was not given any answers. She states they told her that she may have had mini strokes. She also reporting back and left hip pain from falling. Denies any unilateral weakness, no difficulty speaking, no difficulty ambulating.  Past Medical History  Diagnosis Date  . Diabetes mellitus without complication   . MI, old   . Seizures   . Hypertension   . Stroke     tia    Past Surgical History  Procedure Laterality Date  . Abdominal surgery    . Bladder surgery    . Rectal surgery    . Abdominal hysterectomy      partial   No family history on file. History  Substance Use Topics  . Smoking status: Current Every Day Smoker -- 1.00 packs/day    Types: Cigarettes  . Smokeless tobacco: Not on file  . Alcohol Use: No   OB History    No data available     Review of Systems  Constitutional: Positive for fatigue. Negative for fever and chills.  Respiratory: Negative for cough, chest tightness and shortness of breath.   Cardiovascular: Negative for chest pain, palpitations and leg swelling.  Gastrointestinal: Negative for nausea, vomiting, abdominal pain and diarrhea.  Genitourinary: Negative for dysuria, flank pain and pelvic pain.  Musculoskeletal: Positive for myalgias, joint swelling and arthralgias. Negative for neck pain and neck stiffness.  Skin: Negative for rash.  Neurological: Positive for dizziness, syncope, weakness, light-headedness and headaches. Negative for numbness.  All other systems reviewed and are negative.     Allergies  Food and Erythromycin  Home Medications   Prior to Admission medications   Medication Sig Start Date End Date Taking? Authorizing Provider  ALPRAZolam Prudy Feeler(XANAX) 0.25 MG tablet Take 0.25 mg by mouth daily as needed for anxiety.   Yes Historical Provider, MD  amitriptyline (ELAVIL) 75 MG tablet Take 75 mg by mouth at bedtime.   Yes Historical Provider, MD  amLODipine (NORVASC) 5 MG tablet Take 5  mg by mouth daily.   Yes Historical Provider, MD  aspirin EC 81 MG tablet Take 81 mg by mouth 2 (two) times daily.   Yes Historical Provider, MD  diphenhydrAMINE (BENADRYL) 25 MG tablet Take 50 mg by mouth daily as needed (for emergency purpose).   Yes Historical Provider, MD  EPIPEN 2-PAK 0.3 MG/0.3ML SOAJ injection  06/18/14  Yes Historical Provider, MD  FLUoxetine (PROZAC) 40 MG capsule Take 40 mg by mouth daily.    Yes Historical Provider, MD  ibuprofen (ADVIL,MOTRIN) 200 MG tablet Take 600 mg by mouth every 6 (six) hours as needed for mild pain.   Yes Historical Provider, MD  metFORMIN (GLUCOPHAGE) 1000 MG tablet Take 1,000 mg by mouth 2 (two) times daily with a meal.   Yes Historical Provider, MD  Multiple Vitamins-Minerals (ADULT GUMMY) CHEW Chew 1 tablet by mouth daily.   Yes Historical Provider, MD  nortriptyline (PAMELOR) 75 MG capsule Take 75 mg by mouth at bedtime.   Yes Historical Provider, MD  omeprazole (PRILOSEC) 40 MG capsule Take 40 mg by mouth 2 (two) times daily.   Yes Historical Provider, MD  simvastatin (ZOCOR) 20 MG tablet Take 20 mg by mouth at bedtime.   Yes Historical Provider, MD   BP 183/89 mmHg  Pulse 77  Temp(Src) 98.2 F (36.8 C) (Oral)  Resp 18  Ht 5' 5.5" (1.664 m)  Wt 153 lb (69.4 kg)  BMI 25.06 kg/m2  SpO2 98% Physical Exam  Constitutional: She is oriented to person, place, and time. She appears well-developed and well-nourished. No distress.  HENT:  Head: Normocephalic.  Eyes: Conjunctivae and EOM are normal. Pupils are equal, round, and reactive to light.  Neck: Neck supple.  Cardiovascular: Normal rate, regular rhythm, normal heart sounds and intact distal pulses.   Pulmonary/Chest: Effort normal and breath sounds normal. No respiratory distress. She has no wheezes. She has no rales.  Abdominal: Soft. Bowel sounds are normal. She exhibits no distension. There is no tenderness. There is no rebound.  Musculoskeletal: Normal range of motion. She exhibits no edema or tenderness.  Tenderness to palpation of the lumbar midline spine. Tenderness extends into the left SI joint. Pain with left straight leg raise, pain with internal/external rotation of the left hip. Dorsal pedal pulses are equal and intact bilaterally  Neurological: She is alert and oriented to person, place, and time. No cranial nerve deficit. Coordination normal.  5/5 and equal upper and lower extremity  strength bilaterally. Equal grip strength bilaterally. Normal finger to nose and heel to shin. No pronator drift. Patellar reflexes 2+   Skin: Skin is warm and dry.  Psychiatric: She has a normal mood and affect. Her behavior is normal.  Nursing note and vitals reviewed.   ED Course  Procedures (including critical care time) Labs Review Labs Reviewed  CBC WITH DIFFERENTIAL - Abnormal; Notable for the following:    RBC 3.82 (*)    Hemoglobin 11.6 (*)    HCT 35.7 (*)    All other components within normal limits  COMPREHENSIVE METABOLIC PANEL - Abnormal; Notable for the following:    Creatinine, Ser 1.15 (*)    Total Bilirubin <0.2 (*)    GFR calc non Af Amer 47 (*)    GFR calc Af Amer 55 (*)    All other components within normal limits  URINALYSIS, ROUTINE W REFLEX MICROSCOPIC  I-STAT TROPOININ, ED    Imaging Review Dg Chest 2 View  08/18/2014   CLINICAL DATA:  Syncopal  episode today. Low back and left hip pain. Initial encounter.  EXAM: CHEST  2 VIEW  COMPARISON:  None.  FINDINGS: The heart size and mediastinal contours are normal status post median sternotomy. There is aortic atherosclerosis. There is probable pleural parenchymal scarring in the lower left hemithorax. The right lung is clear. No significant pleural effusion is identified. There are degenerative changes throughout the spine. No acute osseous findings are evident.  IMPRESSION: Probable pleural parenchymal scarring in the left hemithorax status post median sternotomy. No acute findings suspected.   Electronically Signed   By: Roxy HorsemanBill  Veazey M.D.   On: 08/18/2014 15:53   Dg Lumbar Spine Complete  08/18/2014   CLINICAL DATA:  Syncopal episode today now with left hip and low back pain. ; history of previous CVA, seizure activity, diabetes, and MI  EXAM: LUMBAR SPINE - COMPLETE 4+ VIEW  COMPARISON:  None.  FINDINGS: The lumbar vertebral bodies are preserved in height. There is gentle levoscoliosis of the mid lumbar spine.  There is disc space narrowing from L2-3 through L4-5. There is no spondylolisthesis. There is mild facet joint hypertrophy at L4-5 and at L5-S1 the pedicles and transverse processes are intact. The observed portions of the sacrum are unremarkable.  IMPRESSION: There is degenerative disc disease from L2-3 through L4-5. There is no acute compression fracture.   Electronically Signed   By: David  [place]Jordan   On: 08/18/2014 15:55   Dg Hip Complete Left  08/18/2014   CLINICAL DATA:  Syncope.Left hip pain.  EXAM: LEFT HIP - COMPLETE 2+ VIEW  COMPARISON:  None  FINDINGS: Both hips are located. There is no acute fracture or subluxation. Mild osteoarthritis is noted involving both hips.  IMPRESSION: 1. No acute findings. 2. Mild bilateral hip osteoarthritis.   Electronically Signed   By: Signa Kellaylor  Stroud M.D.   On: 08/18/2014 16:02     EKG Interpretation   Date/Time:  Monday August 18 2014 14:21:38 EST Ventricular Rate:  71 PR Interval:  158 QRS Duration: 99 QT Interval:  405 QTC Calculation: 440 R Axis:   10 Text Interpretation:  Sinus rhythm Confirmed by Fayrene FearingJAMES  MD, MARK ([id number]) on  08/18/2014 5:15:57 PM      MDM   Final diagnoses:  Syncope  Pain  Secondary hypertension, unspecified  UTI (lower urinary tract infection)    Patient is here after a syncopal episode while at primary care doctor's office. She is hypertensive. Complaining of headache. Neurological exam is normal. She did have some blurred vision which has now resolved. CT of the head is ordered. Labs ordered. Patient is asking for pain medications. Order some Tylenol for her headache. Also ordered patient normal blood pressure medication, Norvasc.   4:54 PM Workup here is unremarkable. Concerning symptoms for possible CVA/TIA  Given blurred vision vs dehydration, vs arrhythmia.   Patient continues to ask for pain medicine, refuses Tylenol, states Dilaudid is the only thing that works.  We'll admit to medicine for further workup  and for monitoring.  5:14 PM Spoke with [facility], will admit.   Filed Vitals:   08/18/14 1416 08/18/14 1416 08/18/14 1445  BP: 178/80 178/80 183/89  Pulse: 79 76 77  Temp:  98.2 F (36.8 C)   TempSrc:  Oral   Resp:  16 18  Height:  5' 5.5" (1.664 m)   Weight:  153 lb (69.4 kg)   SpO2: 98% 98% 98%     Lottie Musselatyana A Ramzi Brathwaite, PA-C 08/19/14 1638  Rolland PorterMark James, MD 08/27/14 1334

## 2014-08-18 NOTE — ED Notes (Signed)
PA Tatyana at bedside.  

## 2014-08-18 NOTE — ED Notes (Signed)
Pt with syncopal episode from pcp's office.  PT has been experiencing diarrhea, syncopal episodes x 1 month.  Saturday pt fell in shower, hit head.  Since then, pt has been experiencing blurred vision in both eyes.  Pt went to see pcp today for check-up and passed out while sitting in chair.  When pt was laid flat, pt came to.  Pt orthostatic per EMS. CBG 111.  Ekg unremarkable per EMS.  500 ns given by EMS.  En-route pt began c/o increased blurred vision and new bp showed 203/90.

## 2014-08-19 DIAGNOSIS — I10 Essential (primary) hypertension: Secondary | ICD-10-CM

## 2014-08-19 DIAGNOSIS — Z90710 Acquired absence of both cervix and uterus: Secondary | ICD-10-CM | POA: Diagnosis not present

## 2014-08-19 DIAGNOSIS — R296 Repeated falls: Secondary | ICD-10-CM | POA: Diagnosis present

## 2014-08-19 DIAGNOSIS — Z7982 Long term (current) use of aspirin: Secondary | ICD-10-CM | POA: Diagnosis not present

## 2014-08-19 DIAGNOSIS — E119 Type 2 diabetes mellitus without complications: Secondary | ICD-10-CM | POA: Diagnosis present

## 2014-08-19 DIAGNOSIS — N390 Urinary tract infection, site not specified: Secondary | ICD-10-CM | POA: Diagnosis present

## 2014-08-19 DIAGNOSIS — E860 Dehydration: Secondary | ICD-10-CM | POA: Diagnosis present

## 2014-08-19 DIAGNOSIS — Z881 Allergy status to other antibiotic agents status: Secondary | ICD-10-CM | POA: Diagnosis not present

## 2014-08-19 DIAGNOSIS — R51 Headache: Secondary | ICD-10-CM

## 2014-08-19 DIAGNOSIS — R569 Unspecified convulsions: Secondary | ICD-10-CM | POA: Diagnosis present

## 2014-08-19 DIAGNOSIS — R55 Syncope and collapse: Principal | ICD-10-CM

## 2014-08-19 DIAGNOSIS — Z8673 Personal history of transient ischemic attack (TIA), and cerebral infarction without residual deficits: Secondary | ICD-10-CM | POA: Diagnosis not present

## 2014-08-19 DIAGNOSIS — Z91018 Allergy to other foods: Secondary | ICD-10-CM | POA: Diagnosis not present

## 2014-08-19 DIAGNOSIS — Z79899 Other long term (current) drug therapy: Secondary | ICD-10-CM | POA: Diagnosis not present

## 2014-08-19 DIAGNOSIS — I252 Old myocardial infarction: Secondary | ICD-10-CM | POA: Diagnosis not present

## 2014-08-19 DIAGNOSIS — I517 Cardiomegaly: Secondary | ICD-10-CM

## 2014-08-19 DIAGNOSIS — Z791 Long term (current) use of non-steroidal anti-inflammatories (NSAID): Secondary | ICD-10-CM | POA: Diagnosis not present

## 2014-08-19 DIAGNOSIS — Z87440 Personal history of urinary (tract) infections: Secondary | ICD-10-CM | POA: Diagnosis not present

## 2014-08-19 DIAGNOSIS — F17210 Nicotine dependence, cigarettes, uncomplicated: Secondary | ICD-10-CM | POA: Diagnosis present

## 2014-08-19 LAB — COMPREHENSIVE METABOLIC PANEL
ALK PHOS: 105 U/L (ref 39–117)
ALT: 9 U/L (ref 0–35)
ANION GAP: 12 (ref 5–15)
AST: 12 U/L (ref 0–37)
Albumin: 3.2 g/dL — ABNORMAL LOW (ref 3.5–5.2)
BUN: 14 mg/dL (ref 6–23)
CO2: 26 meq/L (ref 19–32)
Calcium: 8.8 mg/dL (ref 8.4–10.5)
Chloride: 101 mEq/L (ref 96–112)
Creatinine, Ser: 1.36 mg/dL — ABNORMAL HIGH (ref 0.50–1.10)
GFR, EST AFRICAN AMERICAN: 45 mL/min — AB (ref >90)
GFR, EST NON AFRICAN AMERICAN: 38 mL/min — AB (ref >90)
Glucose, Bld: 143 mg/dL — ABNORMAL HIGH (ref 70–99)
POTASSIUM: 3.9 meq/L (ref 3.7–5.3)
SODIUM: 139 meq/L (ref 137–147)
Total Protein: 6.5 g/dL (ref 6.0–8.3)

## 2014-08-19 LAB — CBC
HCT: 34.9 % — ABNORMAL LOW (ref 36.0–46.0)
Hemoglobin: 11.3 g/dL — ABNORMAL LOW (ref 12.0–15.0)
MCH: 29.6 pg (ref 26.0–34.0)
MCHC: 32.4 g/dL (ref 30.0–36.0)
MCV: 91.4 fL (ref 78.0–100.0)
PLATELETS: 257 10*3/uL (ref 150–400)
RBC: 3.82 MIL/uL — ABNORMAL LOW (ref 3.87–5.11)
RDW: 15.1 % (ref 11.5–15.5)
WBC: 6.5 10*3/uL (ref 4.0–10.5)

## 2014-08-19 LAB — GLUCOSE, CAPILLARY
GLUCOSE-CAPILLARY: 80 mg/dL (ref 70–99)
GLUCOSE-CAPILLARY: 101 mg/dL — AB (ref 70–99)
Glucose-Capillary: 149 mg/dL — ABNORMAL HIGH (ref 70–99)
Glucose-Capillary: 160 mg/dL — ABNORMAL HIGH (ref 70–99)

## 2014-08-19 MED ORDER — HYDROCODONE-ACETAMINOPHEN 5-325 MG PO TABS
1.0000 | ORAL_TABLET | Freq: Four times a day (QID) | ORAL | Status: DC | PRN
  Administered 2014-08-19 (×2): 1 via ORAL
  Filled 2014-08-19 (×3): qty 1

## 2014-08-19 MED ORDER — SODIUM CHLORIDE 0.9 % IV SOLN
INTRAVENOUS | Status: DC
  Administered 2014-08-19: 10:00:00 via INTRAVENOUS

## 2014-08-19 NOTE — Progress Notes (Signed)
  Echocardiogram 2D Echocardiogram has been performed.  Arvil ChacoFoster, Gable Odonohue 08/19/2014, 2:31 PM

## 2014-08-19 NOTE — Evaluation (Signed)
Occupational Therapy Evaluation Patient Details Name: Jill Durham MRN: [id number] DOB: 06/21/1946 Today's Date: 08/19/2014    History of Present Illness 68 y.o. with hx of DM, htn, documented hx of seizures who presents to the ED with recurrent syncope.   Clinical Impression   Pt admitted with above. Education provided during session. Feel pt will benefit from acute OT to increase independence with BADLs, PTA.     Follow Up Recommendations  No OT follow up;Supervision - Intermittent    Equipment Recommendations  3 in 1 bedside comode    Recommendations for Other Services       Precautions / Restrictions Precautions Precautions: Fall Restrictions Weight Bearing Restrictions: No      Mobility Bed Mobility Overal bed mobility: Modified Independent                Transfers Overall transfer level: Needs assistance   Transfers: Sit to/from Stand Sit to Stand: Min guard         General transfer comment: Min guard for safety.    Balance                                            ADL Overall ADL's : Needs assistance/impaired     Grooming: Wash/dry face;Oral care;Set up;Supervision/safety;Standing   Upper Body Bathing: Set up;Supervision/ safety;Standing   Lower Body Bathing: Min guard;Sit to/from stand       Lower Body Dressing: Min guard;Sit to/from stand   Toilet Transfer: Min guard;Moderate assistance (Mod A for one instance of LOB; bed)       Tub/ Shower Transfer: Min guard;Ambulation (practiced stepping over simulated tub)   Functional mobility during ADLs: Min guard;Moderate assistance (Mod A for LOB at beginning of session) General ADL Comments: Advised pt to stop smoking and explained why. Educated on BE FAST stroke education. Educated on safety tips (sitting for most of LB ADLs, rugs, pets, clutter). Recommended spouse be with her for tub transfer and educated on safe technique-pt practiced.  educated on energy  conservation techniques. Few rest breaks taken in session. Educated on LB dressing technique. Educated on 3 in 1.     Vision   Pt wears glasses; reports blurry vision                   Perception     Praxis      Pertinent Vitals/Pain Pain Assessment: 0-10 Pain Score: 9  Pain Location: left leg Pain Descriptors / Indicators: Aching Pain Intervention(s): Monitored during session     Hand Dominance     Extremity/Trunk Assessment Upper Extremity Assessment Upper Extremity Assessment: Generalized weakness   Lower Extremity Assessment Lower Extremity Assessment: Defer to PT evaluation       Communication Communication Communication: No difficulties   Cognition Arousal/Alertness: Awake/alert Behavior During Therapy: WFL for tasks assessed/performed Overall Cognitive Status: Within Functional Limits for tasks assessed                     General Comments       Exercises       Shoulder Instructions      Home Living Family/patient expects to be discharged to:: Private residence Living Arrangements: Spouse/significant other Available Help at Discharge: Family;Available 24 hours/day Type of Home: House Home Access: Stairs to enter Entergy CorporationEntrance Stairs-Number of Steps: 2 and 1 Entrance Stairs-Rails: None Home Layout: One level  Bathroom Shower/Tub: Chief Strategy OfficerTub/shower unit   Bathroom Toilet: Standard (sink close by)     Home Equipment: Environmental consultantWalker - 2 wheels;Cane - single point          Prior Functioning/Environment Level of Independence: Needs assistance    ADL's / Homemaking Assistance Needed: spouse assists with tub transfer   Comments: used cane sometimes    OT Diagnosis: Generalized weakness;Acute pain   OT Problem List: Decreased strength;Decreased activity tolerance;Impaired balance (sitting and/or standing);Decreased knowledge of use of DME or AE;Decreased knowledge of precautions;Pain   OT Treatment/Interventions: Self-care/ADL  training;Therapeutic exercise;DME and/or AE instruction;Therapeutic activities;Patient/family education;Balance training;Energy conservation    OT Goals(Current goals can be found in the care plan section) Acute Rehab OT Goals Patient Stated Goal: go home OT Goal Formulation: With patient Time For Goal Achievement: 08/26/14 Potential to Achieve Goals: Good  OT Frequency: Min 2X/week   Barriers to D/C:            Co-evaluation              End of Session Equipment Utilized During Treatment: Gait belt (O2 placed back on at end of session)  Activity Tolerance: Patient tolerated treatment well Patient left: in bed;with call bell/phone within reach;with bed alarm set   Time: 0940-1007 OT Time Calculation (min): 27 min Charges:  OT General Charges $OT Visit: 1 Procedure OT Evaluation $Initial OT Evaluation Tier I: 1 Procedure OT Treatments $Self Care/Home Management : 8-22 mins G-Codes: OT G-codes **NOT FOR INPATIENT CLASS** Functional Assessment Tool Used: clinical judgment Functional Limitation: Self care Self Care Current Status (R6045(G8987): At least 1 percent but less than 20 percent impaired, limited or restricted Self Care Goal Status (W0981(G8988): 0 percent impaired, limited or restricted  Earlie RavelingStraub, Shina Wass L OTR/L [phone] 08/19/2014, 12:07 PM

## 2014-08-19 NOTE — Progress Notes (Signed)
PROGRESS NOTE  Jill Durham ZOX:[id number] DOB: 1946/08/17 DOA: 08/18/2014 PCP: Pcp Not In System  HPI/Subjective: 68 yo female with a hx of DM, htn, seizures presents to the ED with recurrent syncope.  Pt notes she was sitting at her doctor's office when she had a "warm" feeling come over her and then passed out.  She also notes an episode 3 days ago when she passed out at home and hit her head.  Denies any fever, sweats, chills, chest pain, shortness of breath, palipitations, tachycardia.  She does note dysuria.  Pt has noted some intermittent blurred vision and ha.  Last seizure episode was approximately 5 years ago.  Currently no AED's.  Denies any symptoms of post-ictal state with recent syncopal episode.  Head CT performed in the ED was unremarkable.  UA suggestive of UTI, pt started on Rocephin.    Pt doing well.  No complaints currently.    Assessment/Plan:  Syncope Unknown etiology.  Likely due to UTI.  Question vasovagal.  Not due to orthostatic hypotension.    UA suggestive of UTI.  Head CT shows no acute intracranial findings.      Continue IV Rocephin to treat UTI.  Will obtain a 2D Echo.        UTI Recurrent UTI likely due to diaper use.   UA shows moderate leuks, 21-50 WBC, and many bacteria.  Afebrile.  WBC WNL.  Urine culture pending. Continue IV Rocephin.  Will transition to PO antibiotics on discharge to complete a total of 1 week.       Headache Resolved.  Likely due to elevated BP in ED.   Head CT shows no acute intracranial findings.  Last BP 129/55.  Hypertensive Emergency Resolved.   Last BP 129/55. Continue Amlodipine and PRN Hydralazine.    Diabetes Mellitus, Type 2 Chronic problem.  Well controlled.  Continue Metformin and SSI.       DVT Prophylaxis:  Heparin 5,000units q8h  Code Status: Full Family Communication: Pt is awake and alert.  Disposition Plan: Will d/c home when medically appropriate.     Consultants:  none  Procedures:  2D Echo  Antibiotics:  IV Rocephin started on 11/16  Objective: Filed Vitals:   08/19/14 0003 08/19/14 0034 08/19/14 0435 08/19/14 1032  BP: 122/93 118/55 110/56 129/55  Pulse: 83 83 76   Temp: 98.2 F (36.8 C)  98.3 F (36.8 C)   TempSrc: Oral  Oral   Resp: 18  18   Height:      Weight:   68.992 kg (152 lb 1.6 oz)   SpO2: 96% 96% 95%    No intake or output data in the 24 hours ending 08/19/14 1124 Filed Weights   08/18/14 1416 08/19/14 0435  Weight: 69.4 kg (153 lb) 68.992 kg (152 lb 1.6 oz)    Exam: General: Well developed, well nourished, NAD, appears stated age  HEENT:  EOMI, Anicteic Sclera, MMM.  Cardiovascular: RRR, S1 S2 auscultated, no rubs, murmurs or gallops.   Respiratory: Clear to auscultation bilaterally with equal chest rise  Abdomen: Soft, nondistended, + bowel sounds, mild tenderness over suprapubic region and LLQ  Extremities: warm dry without cyanosis clubbing or edema.  Neuro: AAOx3, cranial nerves grossly intact.  Skin: Without rashes exudates or nodules.   Psych: Normal affect and demeanor with intact judgement and insight   Data Reviewed: Basic Metabolic Panel:  Recent Labs Lab 08/18/14 1448 08/19/14 0410  NA 138 139  K 4.3 3.9  CL  100 101  CO2 24 26  GLUCOSE 88 143*  BUN 14 14  CREATININE 1.15* 1.36*  CALCIUM 9.1 8.8   Liver Function Tests:  Recent Labs Lab 08/18/14 1448 08/19/14 0410  AST 17 12  ALT 11 9  ALKPHOS 114 105  BILITOT <0.2* <0.2*  PROT 7.2 6.5  ALBUMIN 3.5 3.2*   CBC:  Recent Labs Lab 08/18/14 1448 08/19/14 0410  WBC 7.3 6.5  NEUTROABS 3.6  --   HGB 11.6* 11.3*  HCT 35.7* 34.9*  MCV 93.5 91.4  PLT 277 257   CBG:  Recent Labs Lab 08/18/14 2105 08/19/14 0727  GLUCAP 106* 149*    Studies: Dg Chest 2 View  08/18/2014   CLINICAL DATA:  Syncopal episode today. Low back and left hip pain. Initial encounter.  EXAM: CHEST  2 VIEW  COMPARISON:  None.   FINDINGS: The heart size and mediastinal contours are normal status post median sternotomy. There is aortic atherosclerosis. There is probable pleural parenchymal scarring in the lower left hemithorax. The right lung is clear. No significant pleural effusion is identified. There are degenerative changes throughout the spine. No acute osseous findings are evident.  IMPRESSION: Probable pleural parenchymal scarring in the left hemithorax status post median sternotomy. No acute findings suspected.   Electronically Signed   By: Roxy HorsemanBill  Veazey M.D.   On: 08/18/2014 15:53   Dg Lumbar Spine Complete  08/18/2014   CLINICAL DATA:  Syncopal episode today now with left hip and low back pain. ; history of previous CVA, seizure activity, diabetes, and MI  EXAM: LUMBAR SPINE - COMPLETE 4+ VIEW  COMPARISON:  None.  FINDINGS: The lumbar vertebral bodies are preserved in height. There is gentle levoscoliosis of the mid lumbar spine. There is disc space narrowing from L2-3 through L4-5. There is no spondylolisthesis. There is mild facet joint hypertrophy at L4-5 and at L5-S1 the pedicles and transverse processes are intact. The observed portions of the sacrum are unremarkable.  IMPRESSION: There is degenerative disc disease from L2-3 through L4-5. There is no acute compression fracture.   Electronically Signed   By: David  SwazilandJordan   On: 08/18/2014 15:55   Dg Hip Complete Left  08/18/2014   CLINICAL DATA:  Syncope.Left hip pain.  EXAM: LEFT HIP - COMPLETE 2+ VIEW  COMPARISON:  None  FINDINGS: Both hips are located. There is no acute fracture or subluxation. Mild osteoarthritis is noted involving both hips.  IMPRESSION: 1. No acute findings. 2. Mild bilateral hip osteoarthritis.   Electronically Signed   By: Signa Kellaylor  Stroud M.D.   On: 08/18/2014 16:02   Ct Head Wo Contrast  08/18/2014   CLINICAL DATA:  Blurred vision.  Syncope.  EXAM: CT HEAD WITHOUT CONTRAST  TECHNIQUE: Contiguous axial images were obtained from the base of  the skull through the vertex without intravenous contrast.  COMPARISON:  None.  FINDINGS: No acute intracranial hemorrhage. No focal mass lesion. No CT evidence of acute infarction. No midline shift or mass effect. No hydrocephalus. Basilar cisterns are patent.  There is extensive periventricular and subcortical white matter hypodensities. There is a lacunar infarction within the right internal capsule.  Paranasal sinuses and  mastoid air cells are clear.  IMPRESSION: 1. No acute intracranial findings. 2. Extensive white matter microvascular disease. 3. 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   Electronically Signed   By: Genevive BiStewart  Edmunds M.D.   On: 08/18/2014 16:38    Scheduled Meds: . acetaminophen  650 mg  Oral Once  . amitriptyline  75 mg Oral Q2000  . amLODipine  5 mg Oral Daily  . aspirin EC  81 mg Oral BID  . cefTRIAXone (ROCEPHIN)  IV  1 g Intravenous Q24H  . FLUoxetine  40 mg Oral Daily  . heparin  5,000 Units Subcutaneous 3 times per day  . insulin aspart  0-15 Units Subcutaneous TID WC  . insulin aspart  0-5 Units Subcutaneous QHS  . metFORMIN  1,000 mg Oral BID WC  . pantoprazole  40 mg Oral Daily  . simvastatin  20 mg Oral QHS  . sodium chloride  3 mL Intravenous Q12H   Continuous Infusions: . sodium chloride 75 mL/hr at 08/19/14 1023    Principal Problem:   Syncope Active Problems:   Headache   Diabetes type 2, controlled   Hypertensive emergency    Vicki MalletJessica Stevenson PA-S  [facility] Pager [phone]. If 7PM-7AM, please contact night-coverage at [email or web address], password [email or web address] 08/19/2014, 11:24 AM  LOS: 1 day     Addendum  Patient seen and examined, chart and data base reviewed.  I agree with the above assessment and plan.  For full details please see Mrs. Vicki MalletJessica Stevenson PA-S note.   Clint LippsELMAHI,Brae Gartman A, MD [facility] Pager: [phone] 08/19/2014, 1:43 PM

## 2014-08-19 NOTE — Evaluation (Signed)
Physical Therapy Evaluation Patient Details Name: Jill Durham MRN: [id number] DOB: [birth year] Today's Date: 08/19/2014   History of Present Illness  68 y.o. with hx of DM, htn, documented hx of seizures who presents to the ED with recurrent syncope.  Clinical Impression  Pt admitted with the above complications. Pt currently with functional limitations due to the deficits listed below (see PT Problem List). Very unsteady while turning head during gait activities and reports severe dizziness. Pt symptomatic with dix-hallpike test, and treated with Epley's maneuver. Report's frequent falls at home in the past month. Feel there may be a possible vestibular hypofunction component involved with her loss of balance. Provided pt with vestibular exercises and highly recommend follow up with outpatient Neuro physical therapy clinic for continued balance and vestibular training. Pt will benefit from skilled PT to increase their independence and safety with mobility to allow discharge to the venue listed below.      Follow Up Recommendations Home health PT;Supervision for mobility/OOB;Other (comment) (Outpatient NEURO physical therapy -if transportation avail.)    Equipment Recommendations  None recommended by PT    Recommendations for Other Services OT consult     Precautions / Restrictions Precautions Precautions: Fall Restrictions Weight Bearing Restrictions: No      Mobility  Bed Mobility Overal bed mobility: Modified Independent                Transfers Overall transfer level: Needs assistance Equipment used: Rolling walker (2 wheeled) Transfers: Sit to/from Stand Sit to Stand: Supervision         General transfer comment: Supervision for safety. VC for hand placement.  Ambulation/Gait Ambulation/Gait assistance: Min guard Ambulation Distance (Feet): 150 Feet Assistive device: Rolling walker (2 wheeled);None Gait Pattern/deviations: Step-through pattern;Decreased  stride length;Wide base of support;Drifts right/left;Staggering right;Staggering left   Gait velocity interpretation: Below normal speed for age/gender General Gait Details: Edcuated on safe DME use with a rolling walker. Had patient perform dynamic gait challenges. Found to have significant dizziness with head turns in vertical and horizontal directions, most notable left head rotation and looking upward. Trial of ambulation without rolling walker and pt demonstrated several bouts of staggering. Improved greatly with use of RW for stability.  Stairs            Wheelchair Mobility    Modified Rankin (Stroke Patients Only)       Balance Overall balance assessment: Needs assistance;History of Falls Sitting-balance support: No upper extremity supported;Feet supported Sitting balance-Leahy Scale: Good     Standing balance support: No upper extremity supported Standing balance-Leahy Scale: Fair                               Pertinent Vitals/Pain Pain Assessment: 0-10 Pain Score: 8  Pain Location: headache and Lt hip Pain Descriptors / Indicators: Radiating (LE radiates to foot - head feels like screwdriver) Pain Intervention(s): Limited activity within patient's tolerance;Monitored during session;Repositioned;Premedicated before session    Home Living Family/patient expects to be discharged to:: Private residence Living Arrangements: Spouse/significant other Available Help at Discharge: Family;Available 24 hours/day Type of Home: House Home Access: Stairs to enter Entrance Stairs-Rails: None Entrance Stairs-Number of Steps: 2 and 1 Home Layout: One level Home Equipment: Walker - 2 wheels;Cane - single point      Prior Function Level of Independence: Needs assistance   Gait / Transfers Assistance Needed: uses walker and cane for ambulation  ADL's / Homemaking Assistance Needed: Husband  assist with bathing transfer        Hand Dominance   Dominant  Hand: Right    Extremity/Trunk Assessment   Upper Extremity Assessment: Defer to OT evaluation           Lower Extremity Assessment: Overall WFL for tasks assessed         Communication   Communication: No difficulties  Cognition Arousal/Alertness: Awake/alert Behavior During Therapy: WFL for tasks assessed/performed Overall Cognitive Status: Within Functional Limits for tasks assessed                      General Comments General comments (skin integrity, edema, etc.): Pt symptomatic with dix-hallpike test. Performed epley's maneuver. Pt also demonstrates nystagmus and saccades with visual tracking. Educated pt on vestibular hypofunction and gaze stabilization exercises, which she had a very difficult time keeping eyes focused on object during activity.    Exercises General Exercises - Lower Extremity Ankle Circles/Pumps: AROM;Both;10 reps;Supine Quad Sets: Strengthening;Both;10 reps;Supine Gluteal Sets: Strengthening;Both;10 reps;Supine Other Exercises Other Exercises: Gaze stabilization activity x30       Assessment/Plan    PT Assessment Patient needs continued PT services  PT Diagnosis Difficulty walking;Abnormality of gait   PT Problem List Decreased balance;Decreased mobility;Decreased knowledge of use of DME;Decreased knowledge of precautions;Pain  PT Treatment Interventions DME instruction;Gait training;Stair training;Functional mobility training;Therapeutic activities;Therapeutic exercise;Balance training;Neuromuscular re-education;Patient/family education   PT Goals (Current goals can be found in the Care Plan section) Acute Rehab PT Goals Patient Stated Goal: go home PT Goal Formulation: With patient Time For Goal Achievement: 09/02/14 Potential to Achieve Goals: Good    Frequency Min 3X/week   Barriers to discharge        Co-evaluation               End of Session Equipment Utilized During Treatment: Gait belt Activity Tolerance:  Patient tolerated treatment well Patient left: in chair;with call bell/phone within reach;with family/visitor present Nurse Communication: Mobility status         Time: 1525-1606 PT Time Calculation (min) (ACUTE ONLY): 41 min   Charges:   PT Evaluation $Initial PT Evaluation Tier I: 1 Procedure PT Treatments $Gait Training: 8-22 mins $Canalith Rep Proc: 8-22 mins   PT G Codes:          [place], Jill Rawson S 08/19/2014, 4:40 PM  Sunday SpillersLogan Secor [place], [place] CarolinaPT [phone]

## 2014-08-19 NOTE — Progress Notes (Signed)
UR completed 

## 2014-08-20 DIAGNOSIS — Z720 Tobacco use: Secondary | ICD-10-CM | POA: Diagnosis present

## 2014-08-20 DIAGNOSIS — I252 Old myocardial infarction: Secondary | ICD-10-CM

## 2014-08-20 DIAGNOSIS — I159 Secondary hypertension, unspecified: Secondary | ICD-10-CM | POA: Diagnosis present

## 2014-08-20 DIAGNOSIS — N179 Acute kidney failure, unspecified: Secondary | ICD-10-CM | POA: Diagnosis present

## 2014-08-20 DIAGNOSIS — N390 Urinary tract infection, site not specified: Secondary | ICD-10-CM | POA: Diagnosis present

## 2014-08-20 DIAGNOSIS — Z8673 Personal history of transient ischemic attack (TIA), and cerebral infarction without residual deficits: Secondary | ICD-10-CM

## 2014-08-20 LAB — BASIC METABOLIC PANEL
ANION GAP: 11 (ref 5–15)
BUN: 19 mg/dL (ref 6–23)
CHLORIDE: 101 meq/L (ref 96–112)
CO2: 24 meq/L (ref 19–32)
Calcium: 8.9 mg/dL (ref 8.4–10.5)
Creatinine, Ser: 1.37 mg/dL — ABNORMAL HIGH (ref 0.50–1.10)
GFR calc non Af Amer: 38 mL/min — ABNORMAL LOW (ref >90)
GFR, EST AFRICAN AMERICAN: 44 mL/min — AB (ref >90)
Glucose, Bld: 121 mg/dL — ABNORMAL HIGH (ref 70–99)
POTASSIUM: 3.9 meq/L (ref 3.7–5.3)
Sodium: 136 mEq/L — ABNORMAL LOW (ref 137–147)

## 2014-08-20 LAB — GLUCOSE, CAPILLARY
GLUCOSE-CAPILLARY: 97 mg/dL (ref 70–99)
Glucose-Capillary: 128 mg/dL — ABNORMAL HIGH (ref 70–99)

## 2014-08-20 MED ORDER — CIPROFLOXACIN HCL 250 MG PO TABS: 250.0000 mg | ORAL_TABLET | Freq: Two times a day (BID) | ORAL | Status: AC

## 2014-08-20 NOTE — Progress Notes (Signed)
Physical Therapy Treatment Patient Details Name: Jill Durham MRN: [id number] DOB: Dec 02, 1945 Today's Date: 08/20/2014    History of Present Illness 68 y.o. with hx of DM, htn, documented hx of seizures who presents to the ED with recurrent syncope.    PT Comments    Pt admitted with above. Pt currently with functional limitations due to some continued endurance deficits as well as balance deficits without RW.  Will use RW at home for safety.  Pt will benefit from skilled PT to increase their independence and safety with mobility to allow discharge to the venue listed below.    Follow Up Recommendations  Home health PT;Supervision for mobility/OOB;Other (comment) (Outpatient [facility] physical therapy -if transportation avail.)     Equipment Recommendations  3in1 (PT)    Recommendations for Other Services       Precautions / Restrictions Precautions Precautions: Fall Restrictions Weight Bearing Restrictions: No    Mobility  Bed Mobility Overal bed mobility: Modified Independent                Transfers Overall transfer level: Independent                  Ambulation/Gait Ambulation/Gait assistance: Supervision Ambulation Distance (Feet): 280 Feet Assistive device: Rolling walker (2 wheeled);None Gait Pattern/deviations: Step-through pattern;Decreased stride length   Gait velocity interpretation: Below normal speed for age/gender General Gait Details: Edcuated on safe DME use with a rolling walker. Had patient perform dynamic gait challenges. RW helps stability and pt to use if iniitially upon d/c home for safety and pt agrees.     Stairs            Wheelchair Mobility    Modified Rankin (Stroke Patients Only)       Balance Overall balance assessment: Needs assistance Sitting-balance support: No upper extremity supported;Feet supported Sitting balance-Leahy Scale: Good     Standing balance support: No upper extremity supported;During  functional activity Standing balance-Leahy Scale: Fair                      Cognition Arousal/Alertness: Awake/alert Behavior During Therapy: WFL for tasks assessed/performed Overall Cognitive Status: Within Functional Limits for tasks assessed                      Exercises      General Comments General comments (skin integrity, edema, etc.): Scored 17/24 on DGI suggesting at slight  risk of falls without assistive device.  Pt understands she must use RW at all times.   Pt reports vertigo 100% better today.  Pt very happy.  She feels she knows exercises that Whitney PostLogan gave her as well.        Pertinent Vitals/Pain Pain Assessment: No/denies pain  VSS    Home Living                      Prior Function            PT Goals (current goals can now be found in the care plan section) Progress towards PT goals: Progressing toward goals    Frequency  Min 3X/week    PT Plan Current plan remains appropriate    Co-evaluation             End of Session Equipment Utilized During Treatment: Gait belt Activity Tolerance: Patient tolerated treatment well Patient left: in bed;with call bell/phone within reach     Time: [id number] PT Time Calculation (min) (  ACUTE ONLY): 11 min  Charges:  $Gait Training: 8-22 mins                    G Codes:      Berline LopesWhite, Mickey Hebel F 08/20/2014, 2:29 PM Erastus Bartolomei,PT Acute Rehabilitation [phone] (pager)

## 2014-08-20 NOTE — Discharge Instructions (Signed)
Syncope °Syncope means a person passes out (faints). The person usually wakes up in less than 5 minutes. It is important to seek medical care for syncope. °HOME CARE °· Have someone stay with you until you feel normal. °· Do not drive, use machines, or play sports until your doctor says it is okay. °· Keep all doctor visits as told. °· Lie down when you feel like you might pass out. Take deep breaths. Wait until you feel normal before standing up. °· Drink enough fluids to keep your pee (urine) clear or pale yellow. °· If you take blood pressure or heart medicine, get up slowly. Take several minutes to sit and then stand. °GET HELP RIGHT AWAY IF:  °· You have a severe headache. °· You have pain in the chest, belly (abdomen), or back. °· You are bleeding from the mouth or butt (rectum). °· You have black or tarry poop (stool). °· You have an irregular or very fast heartbeat. °· You have pain with breathing. °· You keep passing out, or you have shaking (seizures) when you pass out. °· You pass out when sitting or lying down. °· You feel confused. °· You have trouble walking. °· You have severe weakness. °· You have vision problems. °If you fainted, call your local emergency services (911 in [place] Do not drive yourself to the hospital. °MAKE SURE YOU:  °· Understand these instructions. °· Will watch your condition. °· Will get help right away if you are not doing well or get worse. °Document Released: 03/07/2008 Document Revised: 03/20/2012 Document Reviewed: 11/18/2011 °ExitCare® Patient Information ©2015 [employer]. This information is not intended to replace advice given to you by your health care provider. Make sure you discuss any questions you have with your health care provider. ° °

## 2014-08-20 NOTE — Care Management Note (Signed)
    Page 1 of 1   08/20/2014     11:17:17 AM CARE MANAGEMENT NOTE 08/20/2014  Patient:  Jill Durham,Jill Durham   Account Number:  [id number]  Date Initiated:  08/20/2014  Documentation initiated by:  GRAVES-BIGELOW,Dekota Shenk  Subjective/Objective Assessment:   Pt admitted for syncope and headache. Plan for home today with husband.     Action/Plan:   CM did offer HH services, However pt will be going out of town for at least 2 weeks. CM provided pt with an agency list and pt will need [facility]. MD is aware.   Anticipated DC Date:  08/20/2014   Anticipated DC Plan:  HOME/SELF CARE      DC Planning Services  CM consult      Choice offered to / List presented to:             Status of service:  Completed, signed off Medicare Important Message given?  NA - LOS <3 / Initial given by admissions (If response is "NO", the following Medicare IM given date fields will be blank) Date Medicare IM given:   Medicare IM given by:   Date Additional Medicare IM given:   Additional Medicare IM given by:    Discharge Disposition:  HOME/SELF CARE  Per UR Regulation:  Reviewed for med. necessity/level of care/duration of stay  If discussed at Long Length of Stay Meetings, dates discussed:    Comments:

## 2014-08-20 NOTE — Discharge Summary (Addendum)
Physician Discharge Summary  Jill Durham:[id number] DOB: 07/20/1946 DOA: 08/18/2014  PCP: Corky Downs, MD  Admit date: 08/18/2014 Discharge date: 08/20/2014  Time spent: 35 minutes  Recommendations for Outpatient Follow-up:  1. D/c home with [facility] and PT 2. Follow up with PCP on 11/23. pls check repeat BMET and if renal fn stable resume metformin.  Discharge Diagnoses:  Principal Problem:   Syncope  Active Problems:   Headache   Diabetes type 2, controlled   Hypertensive emergency   Secondary hypertension, unspecified   Acute kidney injury   UTI (lower urinary tract infection)   H/O: CVA (cerebrovascular accident)   History of MI (myocardial infarction)   Tobacco abuse   Discharge Condition: FAIR  Diet recommendation: heart healthy/ diabetic  Filed Weights   08/18/14 1416 08/19/14 0435 08/20/14 0451  Weight: 69.4 kg (153 lb) 68.992 kg (152 lb 1.6 oz) 68.176 kg (150 lb 4.8 oz)    History of present illness:  Please refer to admission Ha&P for details, but in brief, 68 yo female with a hx of DM, HTN, hx of CVA and MI, , remote seizures presents to the ED with recurrent syncope. Pt notes she was sitting at her doctor's office when she had a "warm" feeling come over her and then passed out. She also notes an episode 3 days ago when she passed out at home and hit her head. reportedly her SBP at doctor's office was >200. Denied any fever, sweats, chills, chest pain, shortness of breath, palpitations, tachycardia. She did have dysuria.  Pt noted some intermittent blurred vision and headache as well.  Currently no AED's. Denies any symptoms of post-ictal state with recent syncopal episode. BP in ED was 191/62mmhg. Head CT performed in the ED was unremarkable. UA suggestive of UTI, pt started on Rocephin.   Hospital Course:  Syncope Possibly hypertensive urgency/ emergency vs dehydration with UTI.  CT head on admission unremarkable. admitted on tele and  stable. No further episodes. orthostasis negative. Serial EKG and troponin negative. 2D echo with normal EF and no wall motion abnormality. Seen by PT and recommend HHPT with 24 hr supervision.   UTI  on empiric rocephin. cx not sent. Has mild suprapubic discomfort. No further dysuria. Afebrile and normal wbc. Will d/c on oral ciprofloxacin to complete a 7 day course.   Headache  possibly due to uncontrolled BP. Resolved.  hypertensive urgency/ emergency  noted in PCP office Stable here since admission. Resume home BP meds. Will arrange HHRN  Type 2 DM  stable. Last A1C in jan 2015 of 7.1 ( from PCP office). Will hold metformin given AKI and follow up repeat BMET at PCP offcie next week. If repeat cr next week improved, resumed metformin.  Acute kidney injury  possibly due to dehydration vs NSAIDs use ( reports taking ibuprofen frequently). counseled on stopping. Given IV fluids . Last creatinine in jan 2015 was 1.08. Will hold metformin. Follow up arranged with PCP next week and needs BMET checked. Encouraged increased fluid intake.  Tobacco abuse  counseled on cessation. Refused nicotine patch.   Remaining medical issues are stable.    Patient stable for discharge home with outpt PCP follow up   Procedures:  2D echo  Consultations:  None   Code status: full code  Family communication: none at bedside.  Discharge Exam: Filed Vitals:   08/20/14 0451  BP: 102/77  Pulse: 74  Temp: 98.1 F (36.7 C)  Resp:     General: elderly female  in NAD  HEENT: no pallor, moist mucosa  chest: clear b/l, no added sounds  CVS: N S1 & S2, no murmurs  abd: soft, ND, , mild suprapubic tenderness. BS+ Ext: warm, no edema  CNS: alert and oriented.     Discharge Instructions You were cared for by a hospitalist during your hospital stay. If you have any questions about your discharge medications or the care you received while you were in the hospital after you are discharged,  you can call the unit and asked to speak with the hospitalist on call if the hospitalist that took care of you is not available. Once you are discharged, your primary care physician will handle any further medical issues. Please note that NO REFILLS for any discharge medications will be authorized once you are discharged, as it is imperative that you return to your primary care physician (or establish a relationship with a primary care physician if you do not have one) for your aftercare needs so that they can reassess your need for medications and monitor your lab values.   Current Discharge Medication List    START taking these medications   Details  ciprofloxacin (CIPRO) 250 MG tablet Take 1 tablet (250 mg total) by mouth 2 (two) times daily. Qty: 10 tablet, Refills: 0      CONTINUE these medications which have NOT CHANGED   Details  ALPRAZolam (XANAX) 0.25 MG tablet Take 0.25 mg by mouth daily as needed for anxiety.    amitriptyline (ELAVIL) 75 MG tablet Take 75 mg by mouth at bedtime.    amLODipine (NORVASC) 5 MG tablet Take 5 mg by mouth daily.    aspirin EC 81 MG tablet Take 81 mg by mouth 2 (two) times daily.    diphenhydrAMINE (BENADRYL) 25 MG tablet Take 50 mg by mouth daily as needed (for emergency purpose).    EPIPEN 2-PAK 0.3 MG/0.3ML SOAJ injection     FLUoxetine (PROZAC) 40 MG capsule Take 40 mg by mouth daily.    Multiple Vitamins-Minerals (ADULT GUMMY) CHEW Chew 1 tablet by mouth daily.    omeprazole (PRILOSEC) 40 MG capsule Take 40 mg by mouth 2 (two) times daily.    simvastatin (ZOCOR) 20 MG tablet Take 20 mg by mouth at bedtime.      STOP taking these medications     ibuprofen (ADVIL,MOTRIN) 200 MG tablet      metFORMIN (GLUCOPHAGE) 1000 MG tablet      nortriptyline (PAMELOR) 75 MG capsule        Allergies  Allergen Reactions  . Food Swelling    [place] food= swelling of tongue and face  . Erythromycin Nausea And Vomiting   Follow-up Information     Follow up with Corky Downs, MD On 08/25/2014.   Specialty:  Cardiology   Why:  11:45 am   Contact information:   [address] [place] [address] [phone]        The results of significant diagnostics from this hospitalization (including imaging, microbiology, ancillary and laboratory) are listed below for reference.    Significant Diagnostic Studies: Dg Chest 2 View  08/18/2014   CLINICAL DATA:  Syncopal episode today. Low back and left hip pain. Initial encounter.  EXAM: CHEST  2 VIEW  COMPARISON:  None.  FINDINGS: The heart size and mediastinal contours are normal status post median sternotomy. There is aortic atherosclerosis. There is probable pleural parenchymal scarring in the lower left hemithorax. The right lung is clear. No significant pleural effusion  is identified. There are degenerative changes throughout the spine. No acute osseous findings are evident.  IMPRESSION: Probable pleural parenchymal scarring in the left hemithorax status post median sternotomy. No acute findings suspected.   Electronically Signed   By: Roxy HorsemanBill  Veazey M.D.   On: 08/18/2014 15:53   Dg Lumbar Spine Complete  08/18/2014   CLINICAL DATA:  Syncopal episode today now with left hip and low back pain. ; history of previous CVA, seizure activity, diabetes, and MI  EXAM: LUMBAR SPINE - COMPLETE 4+ VIEW  COMPARISON:  None.  FINDINGS: The lumbar vertebral bodies are preserved in height. There is gentle levoscoliosis of the mid lumbar spine. There is disc space narrowing from L2-3 through L4-5. There is no spondylolisthesis. There is mild facet joint hypertrophy at L4-5 and at L5-S1 the pedicles and transverse processes are intact. The observed portions of the sacrum are unremarkable.  IMPRESSION: There is degenerative disc disease from L2-3 through L4-5. There is no acute compression fracture.   Electronically Signed   By: David  SwazilandJordan   On: 08/18/2014 15:55   Dg Hip Complete Left  08/18/2014    CLINICAL DATA:  Syncope.Left hip pain.  EXAM: LEFT HIP - COMPLETE 2+ VIEW  COMPARISON:  None  FINDINGS: Both hips are located. There is no acute fracture or subluxation. Mild osteoarthritis is noted involving both hips.  IMPRESSION: 1. No acute findings. 2. Mild bilateral hip osteoarthritis.   Electronically Signed   By: Signa Kellaylor  Stroud M.D.   On: 08/18/2014 16:02   Ct Head Wo Contrast  08/18/2014   CLINICAL DATA:  Blurred vision.  Syncope.  EXAM: CT HEAD WITHOUT CONTRAST  TECHNIQUE: Contiguous axial images were obtained from the base of the skull through the vertex without intravenous contrast.  COMPARISON:  None.  FINDINGS: No acute intracranial hemorrhage. No focal mass lesion. No CT evidence of acute infarction. No midline shift or mass effect. No hydrocephalus. Basilar cisterns are patent.  There is extensive periventricular and subcortical white matter hypodensities. There is a lacunar infarction within the right internal capsule.  Paranasal sinuses and  mastoid air cells are clear.  IMPRESSION: 1. No acute intracranial findings. 2. Extensive white matter microvascular disease. 3. 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   Electronically Signed   By: Genevive BiStewart  Edmunds M.D.   On: 08/18/2014 16:38    Microbiology: No results found for this or any previous visit (from the past 240 hour(s)).   Labs: Basic Metabolic Panel:  Recent Labs Lab 08/18/14 1448 08/19/14 0410 08/20/14 0520  NA 138 139 136*  K 4.3 3.9 3.9  CL 100 101 101  CO2 24 26 24   GLUCOSE 88 143* 121*  BUN 14 14 19   CREATININE 1.15* 1.36* 1.37*  CALCIUM 9.1 8.8 8.9   Liver Function Tests:  Recent Labs Lab 08/18/14 1448 08/19/14 0410  AST 17 12  ALT 11 9  ALKPHOS 114 105  BILITOT <0.2* <0.2*  PROT 7.2 6.5  ALBUMIN 3.5 3.2*   No results for input(s): LIPASE, AMYLASE in the last 168 hours. No results for input(s): AMMONIA in the last 168 hours. CBC:  Recent Labs Lab 08/18/14 1448  08/19/14 0410  WBC 7.3 6.5  NEUTROABS 3.6  --   HGB 11.6* 11.3*  HCT 35.7* 34.9*  MCV 93.5 91.4  PLT 277 257   Cardiac Enzymes: No results for input(s): CKTOTAL, CKMB, CKMBINDEX, TROPONINI in the last 168 hours. BNP: BNP (last 3 results) No results for input(s):  PROBNP in the last 8760 hours. CBG:  Recent Labs Lab 08/19/14 0727 08/19/14 1115 08/19/14 1659 08/19/14 2048 08/20/14 0735  GLUCAP 149* 80 160* 101* 128*       Signed:  Brentyn Seehafer  [facility] 08/20/2014, 10:07 AM

## 2014-10-08 ENCOUNTER — Observation Stay: Payer: Self-pay | Admitting: Internal Medicine

## 2014-10-08 DIAGNOSIS — R079 Chest pain, unspecified: Secondary | ICD-10-CM | POA: Diagnosis not present

## 2014-10-08 DIAGNOSIS — Z720 Tobacco use: Secondary | ICD-10-CM | POA: Diagnosis not present

## 2014-10-08 DIAGNOSIS — E785 Hyperlipidemia, unspecified: Secondary | ICD-10-CM | POA: Diagnosis not present

## 2014-10-08 DIAGNOSIS — F419 Anxiety disorder, unspecified: Secondary | ICD-10-CM | POA: Diagnosis not present

## 2014-10-08 DIAGNOSIS — I482 Chronic atrial fibrillation: Secondary | ICD-10-CM | POA: Diagnosis not present

## 2014-10-08 DIAGNOSIS — R51 Headache: Secondary | ICD-10-CM | POA: Diagnosis not present

## 2014-10-08 DIAGNOSIS — R55 Syncope and collapse: Secondary | ICD-10-CM | POA: Diagnosis not present

## 2014-10-08 DIAGNOSIS — E1140 Type 2 diabetes mellitus with diabetic neuropathy, unspecified: Secondary | ICD-10-CM | POA: Diagnosis not present

## 2014-10-08 DIAGNOSIS — N390 Urinary tract infection, site not specified: Secondary | ICD-10-CM | POA: Diagnosis not present

## 2014-10-08 DIAGNOSIS — F329 Major depressive disorder, single episode, unspecified: Secondary | ICD-10-CM | POA: Diagnosis not present

## 2014-10-08 DIAGNOSIS — G40802 Other epilepsy, not intractable, without status epilepticus: Secondary | ICD-10-CM | POA: Diagnosis not present

## 2014-10-08 DIAGNOSIS — I129 Hypertensive chronic kidney disease with stage 1 through stage 4 chronic kidney disease, or unspecified chronic kidney disease: Secondary | ICD-10-CM | POA: Diagnosis not present

## 2014-10-08 DIAGNOSIS — N179 Acute kidney failure, unspecified: Secondary | ICD-10-CM | POA: Diagnosis not present

## 2014-10-08 DIAGNOSIS — E119 Type 2 diabetes mellitus without complications: Secondary | ICD-10-CM | POA: Diagnosis not present

## 2014-10-08 DIAGNOSIS — F418 Other specified anxiety disorders: Secondary | ICD-10-CM | POA: Diagnosis not present

## 2014-10-08 DIAGNOSIS — R0789 Other chest pain: Secondary | ICD-10-CM | POA: Diagnosis not present

## 2014-10-08 LAB — URINALYSIS, COMPLETE
Bilirubin,UR: NEGATIVE
Blood: NEGATIVE
GLUCOSE, UR: NEGATIVE mg/dL (ref 0–75)
Ketone: NEGATIVE
LEUKOCYTE ESTERASE: NEGATIVE
NITRITE: NEGATIVE
PH: 5 (ref 4.5–8.0)
PROTEIN: NEGATIVE
Specific Gravity: 1.009 (ref 1.003–1.030)
Squamous Epithelial: <1

## 2014-10-08 LAB — BASIC METABOLIC PANEL
ANION GAP: 8 (ref 7–16)
BUN: 14 mg/dL (ref 7–18)
CALCIUM: 8.3 mg/dL — AB (ref 8.5–10.1)
Chloride: 103 mmol/L (ref 98–107)
Co2: 27 mmol/L (ref 21–32)
Creatinine: 1.47 mg/dL — ABNORMAL HIGH (ref 0.60–1.30)
GFR CALC AF AMER: 45 — AB
GFR CALC NON AF AMER: 37 — AB
Glucose: 184 mg/dL — ABNORMAL HIGH (ref 65–99)
Osmolality: 281 (ref 275–301)
Potassium: 4 mmol/L (ref 3.5–5.1)
SODIUM: 138 mmol/L (ref 136–145)

## 2014-10-08 LAB — TSH: Thyroid Stimulating Horm: 1.82 u[IU]/mL

## 2014-10-08 LAB — CBC
HCT: 35.9 % (ref 35.0–47.0)
HGB: 11.6 g/dL — ABNORMAL LOW (ref 12.0–16.0)
MCH: 29.8 pg (ref 26.0–34.0)
MCHC: 32.3 g/dL (ref 32.0–36.0)
MCV: 92 fL (ref 80–100)
PLATELETS: 324 10*3/uL (ref 150–440)
RBC: 3.88 10*6/uL (ref 3.80–5.20)
RDW: 15.6 % — AB (ref 11.5–14.5)
WBC: 9.1 10*3/uL (ref 3.6–11.0)

## 2014-10-08 LAB — PRO B NATRIURETIC PEPTIDE: B-TYPE NATIURETIC PEPTID: 498 pg/mL — AB (ref 0–125)

## 2014-10-08 LAB — PROTIME-INR
INR: 0.8
Prothrombin Time: 11.1 secs — ABNORMAL LOW (ref 11.5–14.7)

## 2014-10-08 LAB — TROPONIN I
Troponin-I: <0.02 ng/mL
Troponin-I: <0.02 ng/mL

## 2014-10-09 DIAGNOSIS — I482 Chronic atrial fibrillation: Secondary | ICD-10-CM | POA: Diagnosis not present

## 2014-10-09 DIAGNOSIS — E1140 Type 2 diabetes mellitus with diabetic neuropathy, unspecified: Secondary | ICD-10-CM | POA: Diagnosis not present

## 2014-10-09 DIAGNOSIS — I129 Hypertensive chronic kidney disease with stage 1 through stage 4 chronic kidney disease, or unspecified chronic kidney disease: Secondary | ICD-10-CM | POA: Diagnosis not present

## 2014-10-09 DIAGNOSIS — E119 Type 2 diabetes mellitus without complications: Secondary | ICD-10-CM | POA: Diagnosis not present

## 2014-10-09 DIAGNOSIS — N179 Acute kidney failure, unspecified: Secondary | ICD-10-CM | POA: Diagnosis not present

## 2014-10-09 DIAGNOSIS — F419 Anxiety disorder, unspecified: Secondary | ICD-10-CM | POA: Diagnosis not present

## 2014-10-09 DIAGNOSIS — F329 Major depressive disorder, single episode, unspecified: Secondary | ICD-10-CM | POA: Diagnosis not present

## 2014-10-09 DIAGNOSIS — R55 Syncope and collapse: Secondary | ICD-10-CM | POA: Diagnosis not present

## 2014-10-09 DIAGNOSIS — F418 Other specified anxiety disorders: Secondary | ICD-10-CM | POA: Diagnosis not present

## 2014-10-09 DIAGNOSIS — M25531 Pain in right wrist: Secondary | ICD-10-CM | POA: Diagnosis not present

## 2014-10-09 DIAGNOSIS — E785 Hyperlipidemia, unspecified: Secondary | ICD-10-CM | POA: Diagnosis not present

## 2014-10-09 DIAGNOSIS — N183 Chronic kidney disease, stage 3 (moderate): Secondary | ICD-10-CM | POA: Diagnosis not present

## 2014-10-09 DIAGNOSIS — G40802 Other epilepsy, not intractable, without status epilepticus: Secondary | ICD-10-CM | POA: Diagnosis not present

## 2014-10-09 DIAGNOSIS — R079 Chest pain, unspecified: Secondary | ICD-10-CM | POA: Diagnosis not present

## 2014-10-09 LAB — BASIC METABOLIC PANEL
ANION GAP: 5 — AB (ref 7–16)
BUN: 13 mg/dL (ref 7–18)
CALCIUM: 7.9 mg/dL — AB (ref 8.5–10.1)
CHLORIDE: 107 mmol/L (ref 98–107)
Co2: 27 mmol/L (ref 21–32)
Creatinine: 1.34 mg/dL — ABNORMAL HIGH (ref 0.60–1.30)
EGFR (African American): 50 — ABNORMAL LOW
EGFR (Non-African Amer.): 42 — ABNORMAL LOW
Glucose: 107 mg/dL — ABNORMAL HIGH (ref 65–99)
OSMOLALITY: 278 (ref 275–301)
Potassium: 4 mmol/L (ref 3.5–5.1)
Sodium: 139 mmol/L (ref 136–145)

## 2014-10-09 LAB — CBC WITH DIFFERENTIAL/PLATELET
BASOS ABS: 0.1 10*3/uL (ref 0.0–0.1)
BASOS PCT: 1 %
EOS ABS: 0.3 10*3/uL (ref 0.0–0.7)
Eosinophil %: 3.2 %
HCT: 35.1 % (ref 35.0–47.0)
HGB: 11.2 g/dL — AB (ref 12.0–16.0)
LYMPHS ABS: 3.3 10*3/uL (ref 1.0–3.6)
Lymphocyte %: 40.3 %
MCH: 29.8 pg (ref 26.0–34.0)
MCHC: 31.8 g/dL — ABNORMAL LOW (ref 32.0–36.0)
MCV: 94 fL (ref 80–100)
MONO ABS: 0.7 x10 3/mm (ref 0.2–0.9)
Monocyte %: 8.8 %
NEUTROS ABS: 3.8 10*3/uL (ref 1.4–6.5)
Neutrophil %: 46.7 %
Platelet: 283 10*3/uL (ref 150–440)
RBC: 3.75 10*6/uL — ABNORMAL LOW (ref 3.80–5.20)
RDW: 15.8 % — AB (ref 11.5–14.5)
WBC: 8.1 10*3/uL (ref 3.6–11.0)

## 2014-10-09 LAB — TSH: Thyroid Stimulating Horm: 2.47 u[IU]/mL

## 2014-10-09 LAB — MAGNESIUM: MAGNESIUM: 1.7 mg/dL — AB

## 2014-10-09 LAB — TROPONIN I

## 2014-10-10 DIAGNOSIS — F329 Major depressive disorder, single episode, unspecified: Secondary | ICD-10-CM | POA: Diagnosis not present

## 2014-10-10 DIAGNOSIS — R079 Chest pain, unspecified: Secondary | ICD-10-CM | POA: Diagnosis not present

## 2014-10-10 DIAGNOSIS — R55 Syncope and collapse: Secondary | ICD-10-CM | POA: Diagnosis not present

## 2014-10-10 DIAGNOSIS — G40209 Localization-related (focal) (partial) symptomatic epilepsy and epileptic syndromes with complex partial seizures, not intractable, without status epilepticus: Secondary | ICD-10-CM | POA: Diagnosis not present

## 2014-10-10 DIAGNOSIS — I129 Hypertensive chronic kidney disease with stage 1 through stage 4 chronic kidney disease, or unspecified chronic kidney disease: Secondary | ICD-10-CM | POA: Diagnosis not present

## 2014-10-10 DIAGNOSIS — N179 Acute kidney failure, unspecified: Secondary | ICD-10-CM | POA: Diagnosis not present

## 2014-10-10 DIAGNOSIS — E785 Hyperlipidemia, unspecified: Secondary | ICD-10-CM | POA: Diagnosis not present

## 2014-10-10 DIAGNOSIS — N183 Chronic kidney disease, stage 3 (moderate): Secondary | ICD-10-CM | POA: Diagnosis not present

## 2014-10-10 DIAGNOSIS — F418 Other specified anxiety disorders: Secondary | ICD-10-CM | POA: Diagnosis not present

## 2014-10-10 DIAGNOSIS — E1140 Type 2 diabetes mellitus with diabetic neuropathy, unspecified: Secondary | ICD-10-CM | POA: Diagnosis not present

## 2014-10-10 DIAGNOSIS — F419 Anxiety disorder, unspecified: Secondary | ICD-10-CM | POA: Diagnosis not present

## 2014-10-10 DIAGNOSIS — G40802 Other epilepsy, not intractable, without status epilepticus: Secondary | ICD-10-CM | POA: Diagnosis not present

## 2014-10-10 DIAGNOSIS — I482 Chronic atrial fibrillation: Secondary | ICD-10-CM | POA: Diagnosis not present

## 2014-10-10 DIAGNOSIS — E119 Type 2 diabetes mellitus without complications: Secondary | ICD-10-CM | POA: Diagnosis not present

## 2014-10-10 LAB — BASIC METABOLIC PANEL
Anion Gap: 7 (ref 7–16)
BUN: 13 mg/dL (ref 7–18)
CREATININE: 1.41 mg/dL — AB (ref 0.60–1.30)
Calcium, Total: 8.3 mg/dL — ABNORMAL LOW (ref 8.5–10.1)
Chloride: 106 mmol/L (ref 98–107)
Co2: 28 mmol/L (ref 21–32)
EGFR (Non-African Amer.): 39 — ABNORMAL LOW
GFR CALC AF AMER: 47 — AB
Glucose: 134 mg/dL — ABNORMAL HIGH (ref 65–99)
Osmolality: 283 (ref 275–301)
Potassium: 4 mmol/L (ref 3.5–5.1)
Sodium: 141 mmol/L (ref 136–145)

## 2014-10-11 LAB — URINE CULTURE

## 2014-10-15 ENCOUNTER — Emergency Department: Payer: Self-pay | Admitting: Emergency Medicine

## 2014-10-15 DIAGNOSIS — M797 Fibromyalgia: Secondary | ICD-10-CM | POA: Diagnosis not present

## 2014-10-15 DIAGNOSIS — I2510 Atherosclerotic heart disease of native coronary artery without angina pectoris: Secondary | ICD-10-CM | POA: Diagnosis not present

## 2014-10-15 DIAGNOSIS — R55 Syncope and collapse: Secondary | ICD-10-CM | POA: Diagnosis not present

## 2014-10-15 DIAGNOSIS — I10 Essential (primary) hypertension: Secondary | ICD-10-CM | POA: Diagnosis not present

## 2014-10-15 DIAGNOSIS — E119 Type 2 diabetes mellitus without complications: Secondary | ICD-10-CM | POA: Diagnosis not present

## 2014-10-15 DIAGNOSIS — R6889 Other general symptoms and signs: Secondary | ICD-10-CM | POA: Diagnosis not present

## 2014-10-15 DIAGNOSIS — R5382 Chronic fatigue, unspecified: Secondary | ICD-10-CM | POA: Diagnosis not present

## 2014-10-15 LAB — URINALYSIS, COMPLETE
BILIRUBIN, UR: NEGATIVE
Blood: NEGATIVE
GLUCOSE, UR: NEGATIVE mg/dL (ref 0–75)
KETONE: NEGATIVE
Leukocyte Esterase: NEGATIVE
Nitrite: NEGATIVE
PROTEIN: NEGATIVE
Ph: 7 (ref 4.5–8.0)
RBC,UR: <1 /HPF (ref 0–5)
Specific Gravity: 1.005 (ref 1.003–1.030)
Squamous Epithelial: NONE SEEN
WBC UR: 7 /HPF (ref 0–5)

## 2014-10-15 LAB — CBC WITH DIFFERENTIAL/PLATELET
BASOS ABS: 0.1 10*3/uL (ref 0.0–0.1)
Basophil %: 1 %
Eosinophil #: 0.2 10*3/uL (ref 0.0–0.7)
Eosinophil %: 1.9 %
HCT: 34.6 % — AB (ref 35.0–47.0)
HGB: 11.2 g/dL — ABNORMAL LOW (ref 12.0–16.0)
Lymphocyte #: 3 10*3/uL (ref 1.0–3.6)
Lymphocyte %: 35.4 %
MCH: 29.7 pg (ref 26.0–34.0)
MCHC: 32.4 g/dL (ref 32.0–36.0)
MCV: 92 fL (ref 80–100)
MONO ABS: 0.7 x10 3/mm (ref 0.2–0.9)
Monocyte %: 8.6 %
NEUTROS ABS: 4.5 10*3/uL (ref 1.4–6.5)
Neutrophil %: 53.1 %
PLATELETS: 283 10*3/uL (ref 150–440)
RBC: 3.77 10*6/uL — ABNORMAL LOW (ref 3.80–5.20)
RDW: 15.9 % — AB (ref 11.5–14.5)
WBC: 8.4 10*3/uL (ref 3.6–11.0)

## 2014-10-15 LAB — BASIC METABOLIC PANEL
ANION GAP: 8 (ref 7–16)
BUN: 10 mg/dL (ref 7–18)
Calcium, Total: 8.4 mg/dL — ABNORMAL LOW (ref 8.5–10.1)
Chloride: 104 mmol/L (ref 98–107)
Co2: 26 mmol/L (ref 21–32)
Creatinine: 1.26 mg/dL (ref 0.60–1.30)
EGFR (African American): 54 — ABNORMAL LOW
GFR CALC NON AF AMER: 45 — AB
Glucose: 144 mg/dL — ABNORMAL HIGH (ref 65–99)
OSMOLALITY: 277 (ref 275–301)
Potassium: 3.9 mmol/L (ref 3.5–5.1)
Sodium: 138 mmol/L (ref 136–145)

## 2014-10-15 LAB — TROPONIN I

## 2014-10-23 DIAGNOSIS — R42 Dizziness and giddiness: Secondary | ICD-10-CM | POA: Diagnosis not present

## 2014-10-23 DIAGNOSIS — D649 Anemia, unspecified: Secondary | ICD-10-CM | POA: Diagnosis not present

## 2014-11-03 ENCOUNTER — Emergency Department: Payer: Self-pay | Admitting: Emergency Medicine

## 2014-11-03 DIAGNOSIS — R51 Headache: Secondary | ICD-10-CM | POA: Diagnosis not present

## 2014-11-03 DIAGNOSIS — I10 Essential (primary) hypertension: Secondary | ICD-10-CM | POA: Diagnosis not present

## 2014-11-03 DIAGNOSIS — Z720 Tobacco use: Secondary | ICD-10-CM | POA: Diagnosis not present

## 2014-11-03 DIAGNOSIS — R55 Syncope and collapse: Secondary | ICD-10-CM | POA: Diagnosis not present

## 2014-11-03 DIAGNOSIS — Z794 Long term (current) use of insulin: Secondary | ICD-10-CM | POA: Diagnosis not present

## 2014-11-03 DIAGNOSIS — Z7982 Long term (current) use of aspirin: Secondary | ICD-10-CM | POA: Diagnosis not present

## 2014-11-03 DIAGNOSIS — Z79899 Other long term (current) drug therapy: Secondary | ICD-10-CM | POA: Diagnosis not present

## 2014-11-03 DIAGNOSIS — E119 Type 2 diabetes mellitus without complications: Secondary | ICD-10-CM | POA: Diagnosis not present

## 2014-11-03 LAB — CBC WITH DIFFERENTIAL/PLATELET
Basophil #: 0.1 10*3/uL (ref 0.0–0.1)
Basophil %: 1.2 %
EOS ABS: 0.2 10*3/uL (ref 0.0–0.7)
Eosinophil %: 2.1 %
HCT: 33.8 % — ABNORMAL LOW (ref 35.0–47.0)
HGB: 11.3 g/dL — ABNORMAL LOW (ref 12.0–16.0)
LYMPHS ABS: 2.9 10*3/uL (ref 1.0–3.6)
Lymphocyte %: 31.8 %
MCH: 30 pg (ref 26.0–34.0)
MCHC: 33.3 g/dL (ref 32.0–36.0)
MCV: 90 fL (ref 80–100)
Monocyte #: 0.9 x10 3/mm (ref 0.2–0.9)
Monocyte %: 9.5 %
NEUTROS ABS: 5 10*3/uL (ref 1.4–6.5)
Neutrophil %: 55.4 %
Platelet: 260 10*3/uL (ref 150–440)
RBC: 3.76 10*6/uL — ABNORMAL LOW (ref 3.80–5.20)
RDW: 16.3 % — AB (ref 11.5–14.5)
WBC: 9 10*3/uL (ref 3.6–11.0)

## 2014-11-03 LAB — BASIC METABOLIC PANEL
Anion Gap: 6 — ABNORMAL LOW (ref 7–16)
BUN: 18 mg/dL (ref 7–18)
CREATININE: 1.29 mg/dL (ref 0.60–1.30)
Calcium, Total: 8.7 mg/dL (ref 8.5–10.1)
Chloride: 105 mmol/L (ref 98–107)
Co2: 29 mmol/L (ref 21–32)
GFR CALC AF AMER: 53 — AB
GFR CALC NON AF AMER: 43 — AB
Glucose: 114 mg/dL — ABNORMAL HIGH (ref 65–99)
Osmolality: 282 (ref 275–301)
Potassium: 4.2 mmol/L (ref 3.5–5.1)
Sodium: 140 mmol/L (ref 136–145)

## 2014-11-03 LAB — TROPONIN I

## 2014-11-05 DIAGNOSIS — R55 Syncope and collapse: Secondary | ICD-10-CM | POA: Diagnosis not present

## 2014-11-05 DIAGNOSIS — R42 Dizziness and giddiness: Secondary | ICD-10-CM | POA: Diagnosis not present

## 2014-11-20 ENCOUNTER — Emergency Department: Payer: Self-pay | Admitting: Emergency Medicine

## 2014-11-20 DIAGNOSIS — Z7982 Long term (current) use of aspirin: Secondary | ICD-10-CM | POA: Diagnosis not present

## 2014-11-20 DIAGNOSIS — R55 Syncope and collapse: Secondary | ICD-10-CM | POA: Diagnosis not present

## 2014-11-20 DIAGNOSIS — S0990XA Unspecified injury of head, initial encounter: Secondary | ICD-10-CM | POA: Diagnosis not present

## 2014-11-20 DIAGNOSIS — R1032 Left lower quadrant pain: Secondary | ICD-10-CM | POA: Diagnosis not present

## 2014-11-20 DIAGNOSIS — E119 Type 2 diabetes mellitus without complications: Secondary | ICD-10-CM | POA: Diagnosis not present

## 2014-11-20 DIAGNOSIS — Z720 Tobacco use: Secondary | ICD-10-CM | POA: Diagnosis not present

## 2014-11-20 DIAGNOSIS — I10 Essential (primary) hypertension: Secondary | ICD-10-CM | POA: Diagnosis not present

## 2014-11-20 DIAGNOSIS — R51 Headache: Secondary | ICD-10-CM | POA: Diagnosis not present

## 2014-11-20 DIAGNOSIS — E860 Dehydration: Secondary | ICD-10-CM | POA: Diagnosis not present

## 2014-11-20 DIAGNOSIS — Z79899 Other long term (current) drug therapy: Secondary | ICD-10-CM | POA: Diagnosis not present

## 2014-11-24 DIAGNOSIS — E119 Type 2 diabetes mellitus without complications: Secondary | ICD-10-CM | POA: Diagnosis not present

## 2014-11-24 DIAGNOSIS — M353 Polymyalgia rheumatica: Secondary | ICD-10-CM | POA: Diagnosis not present

## 2014-11-24 DIAGNOSIS — K30 Functional dyspepsia: Secondary | ICD-10-CM | POA: Diagnosis not present

## 2014-11-24 DIAGNOSIS — K210 Gastro-esophageal reflux disease with esophagitis: Secondary | ICD-10-CM | POA: Diagnosis not present

## 2014-11-25 DIAGNOSIS — M797 Fibromyalgia: Secondary | ICD-10-CM | POA: Diagnosis not present

## 2014-11-25 DIAGNOSIS — R5382 Chronic fatigue, unspecified: Secondary | ICD-10-CM | POA: Diagnosis not present

## 2014-11-28 DIAGNOSIS — R55 Syncope and collapse: Secondary | ICD-10-CM | POA: Diagnosis not present

## 2014-12-03 DIAGNOSIS — R55 Syncope and collapse: Secondary | ICD-10-CM | POA: Diagnosis not present

## 2014-12-03 DIAGNOSIS — R42 Dizziness and giddiness: Secondary | ICD-10-CM | POA: Diagnosis not present

## 2014-12-08 DIAGNOSIS — R55 Syncope and collapse: Secondary | ICD-10-CM | POA: Diagnosis not present

## 2014-12-08 DIAGNOSIS — E119 Type 2 diabetes mellitus without complications: Secondary | ICD-10-CM | POA: Diagnosis not present

## 2014-12-14 ENCOUNTER — Emergency Department: Payer: Self-pay | Admitting: Emergency Medicine

## 2014-12-14 DIAGNOSIS — E119 Type 2 diabetes mellitus without complications: Secondary | ICD-10-CM | POA: Diagnosis not present

## 2014-12-14 DIAGNOSIS — Z87891 Personal history of nicotine dependence: Secondary | ICD-10-CM | POA: Diagnosis not present

## 2014-12-14 DIAGNOSIS — S79911A Unspecified injury of right hip, initial encounter: Secondary | ICD-10-CM | POA: Diagnosis not present

## 2014-12-14 DIAGNOSIS — F329 Major depressive disorder, single episode, unspecified: Secondary | ICD-10-CM | POA: Diagnosis not present

## 2014-12-14 DIAGNOSIS — I10 Essential (primary) hypertension: Secondary | ICD-10-CM | POA: Diagnosis not present

## 2014-12-14 DIAGNOSIS — S7001XA Contusion of right hip, initial encounter: Secondary | ICD-10-CM | POA: Diagnosis not present

## 2014-12-14 DIAGNOSIS — Z79899 Other long term (current) drug therapy: Secondary | ICD-10-CM | POA: Diagnosis not present

## 2014-12-14 DIAGNOSIS — Z7982 Long term (current) use of aspirin: Secondary | ICD-10-CM | POA: Diagnosis not present

## 2014-12-14 DIAGNOSIS — F419 Anxiety disorder, unspecified: Secondary | ICD-10-CM | POA: Diagnosis not present

## 2014-12-14 DIAGNOSIS — R102 Pelvic and perineal pain: Secondary | ICD-10-CM | POA: Diagnosis not present

## 2014-12-14 DIAGNOSIS — Z794 Long term (current) use of insulin: Secondary | ICD-10-CM | POA: Diagnosis not present

## 2014-12-15 DIAGNOSIS — R55 Syncope and collapse: Secondary | ICD-10-CM | POA: Diagnosis not present

## 2014-12-15 DIAGNOSIS — R5382 Chronic fatigue, unspecified: Secondary | ICD-10-CM | POA: Diagnosis not present

## 2014-12-18 ENCOUNTER — Observation Stay: Payer: Self-pay | Admitting: Internal Medicine

## 2014-12-18 DIAGNOSIS — G4459 Other complicated headache syndrome: Secondary | ICD-10-CM | POA: Diagnosis not present

## 2014-12-18 DIAGNOSIS — I25119 Atherosclerotic heart disease of native coronary artery with unspecified angina pectoris: Secondary | ICD-10-CM | POA: Diagnosis not present

## 2014-12-18 DIAGNOSIS — E1140 Type 2 diabetes mellitus with diabetic neuropathy, unspecified: Secondary | ICD-10-CM | POA: Diagnosis not present

## 2014-12-18 DIAGNOSIS — I480 Paroxysmal atrial fibrillation: Secondary | ICD-10-CM | POA: Diagnosis not present

## 2014-12-18 DIAGNOSIS — R110 Nausea: Secondary | ICD-10-CM | POA: Diagnosis not present

## 2014-12-18 DIAGNOSIS — R079 Chest pain, unspecified: Secondary | ICD-10-CM | POA: Diagnosis not present

## 2014-12-18 DIAGNOSIS — I2510 Atherosclerotic heart disease of native coronary artery without angina pectoris: Secondary | ICD-10-CM | POA: Diagnosis not present

## 2014-12-18 DIAGNOSIS — R55 Syncope and collapse: Secondary | ICD-10-CM | POA: Diagnosis not present

## 2014-12-18 DIAGNOSIS — R51 Headache: Secondary | ICD-10-CM | POA: Diagnosis not present

## 2014-12-18 DIAGNOSIS — R569 Unspecified convulsions: Secondary | ICD-10-CM | POA: Diagnosis not present

## 2014-12-18 DIAGNOSIS — R0789 Other chest pain: Secondary | ICD-10-CM | POA: Diagnosis not present

## 2014-12-18 DIAGNOSIS — G43109 Migraine with aura, not intractable, without status migrainosus: Secondary | ICD-10-CM | POA: Diagnosis not present

## 2014-12-18 DIAGNOSIS — E785 Hyperlipidemia, unspecified: Secondary | ICD-10-CM | POA: Diagnosis not present

## 2014-12-18 DIAGNOSIS — I10 Essential (primary) hypertension: Secondary | ICD-10-CM | POA: Diagnosis not present

## 2014-12-18 DIAGNOSIS — Z8673 Personal history of transient ischemic attack (TIA), and cerebral infarction without residual deficits: Secondary | ICD-10-CM | POA: Diagnosis not present

## 2014-12-18 DIAGNOSIS — R0602 Shortness of breath: Secondary | ICD-10-CM | POA: Diagnosis not present

## 2014-12-18 DIAGNOSIS — G40802 Other epilepsy, not intractable, without status epilepticus: Secondary | ICD-10-CM | POA: Diagnosis not present

## 2014-12-18 DIAGNOSIS — I209 Angina pectoris, unspecified: Secondary | ICD-10-CM | POA: Diagnosis not present

## 2014-12-19 DIAGNOSIS — G43109 Migraine with aura, not intractable, without status migrainosus: Secondary | ICD-10-CM | POA: Diagnosis not present

## 2014-12-19 DIAGNOSIS — G40802 Other epilepsy, not intractable, without status epilepticus: Secondary | ICD-10-CM | POA: Diagnosis not present

## 2014-12-19 DIAGNOSIS — R079 Chest pain, unspecified: Secondary | ICD-10-CM | POA: Diagnosis not present

## 2014-12-19 DIAGNOSIS — I209 Angina pectoris, unspecified: Secondary | ICD-10-CM | POA: Diagnosis not present

## 2014-12-19 DIAGNOSIS — R0789 Other chest pain: Secondary | ICD-10-CM | POA: Diagnosis not present

## 2014-12-19 DIAGNOSIS — I25119 Atherosclerotic heart disease of native coronary artery with unspecified angina pectoris: Secondary | ICD-10-CM | POA: Diagnosis not present

## 2014-12-19 DIAGNOSIS — E785 Hyperlipidemia, unspecified: Secondary | ICD-10-CM | POA: Diagnosis not present

## 2014-12-19 DIAGNOSIS — I2510 Atherosclerotic heart disease of native coronary artery without angina pectoris: Secondary | ICD-10-CM | POA: Diagnosis not present

## 2014-12-19 DIAGNOSIS — I10 Essential (primary) hypertension: Secondary | ICD-10-CM | POA: Diagnosis not present

## 2014-12-19 DIAGNOSIS — Z8673 Personal history of transient ischemic attack (TIA), and cerebral infarction without residual deficits: Secondary | ICD-10-CM | POA: Diagnosis not present

## 2014-12-19 DIAGNOSIS — I480 Paroxysmal atrial fibrillation: Secondary | ICD-10-CM | POA: Diagnosis not present

## 2014-12-19 DIAGNOSIS — R55 Syncope and collapse: Secondary | ICD-10-CM | POA: Diagnosis not present

## 2014-12-19 DIAGNOSIS — R569 Unspecified convulsions: Secondary | ICD-10-CM | POA: Diagnosis not present

## 2014-12-19 DIAGNOSIS — E1140 Type 2 diabetes mellitus with diabetic neuropathy, unspecified: Secondary | ICD-10-CM | POA: Diagnosis not present

## 2015-01-20 NOTE — Discharge Summary (Signed)
PATIENT NAME:  Jill Durham, Jill Durham MR#:  [id number] DATE OF BIRTH:  Nov 01, 1945  DATE OF ADMISSION:  06/09/2012 DATE OF DISCHARGE:  06/10/2012  For a detailed note, please take a look at the history and physical done on admission by Dr. Imogene Burnhen.   DIAGNOSES AT DISCHARGE:  1. Chest pain, atypical and likely musculoskeletal in nature. 2. History of diabetes.  3. Hypertension. 4. Gastroesophageal reflux disease. 5. Tobacco abuse.   DIET: Patient is being discharged on a low-sodium, low-fat, carb-control ADA diet.   ACTIVITY: As tolerated.   FOLLOW UP: [facility].    DISCHARGE MEDICATIONS:  1. Amitriptyline 150 mg at bedtime.  2. Lantus 20 units daily.  3. Aspirin 81 mg daily.  4. Metformin 1000 mg b.i.d.  5. Omeprazole 40 mg daily.  6. Buspirone 10 mg b.i.d.  7. Enalapril 5 mg daily.  8. Carafate 1 gram q.i.d.   LABORATORY, DIAGNOSTIC AND RADIOLOGICAL DATA: A chest x-ray done on admission showing interstitial markings in the left lung base laterally. No other acute abnormality noted. An ultrasound of the lower extremity showing no evidence of any deep venous thrombosis. A ventilation perfusion lung scan showing findings consistent with low probability for pulmonary embolism.   BRIEF HOSPITAL COURSE: This is a 69 year old female with medical problems as mentioned above presented to the hospital with chest pain.  1. Chest pain. Patient has significant risk factors given history of diabetes and hypertension therefore was observed overnight on telemetry, had three sets of cardiac markers checked which were negative. She also had elevated d-dimer and therefore underwent Doppler'[place] of her lower extremity, also a ventilation perfusion scan which was low probability for PE. Her chest pain seems more likely musculoskeletal in nature. Since there was no evidence of any acute cardiopulmonary issues she was discharged home with close follow up with her  primary care physician as an outpatient.  2. Diabetes. Patient had no evidence of hypo or hyperglycemia. She will resume her Lantus and metformin as stated.  3. Hypertension. Patient was maintained on enalapril and she will resume that.  4. Anxiety. Patient was maintained on buspirone, she will resume that.  5. Gastroesophageal reflux disease. Patient was maintained on omeprazole. She will resume that upon discharge too.  6. CODE STATUS: Patient is a FULL CODE.   TIME SPENT: 35 minutes.  ____________________________ Rolly PancakeVivek J. Cherlynn KaiserSainani, MD vjs:cms D: 06/11/2012 07:49:43 ET T: 06/12/2012 11:48:02 ET JOB#: [id number]  cc: Rolly PancakeVivek J. Cherlynn KaiserSainani, MD, <Dictator> [facility]  [place] SirenVIVEK J Trice Aspinall MD ELECTRONICALLY SIGNED 06/16/2012 13:40

## 2015-01-20 NOTE — H&P (Signed)
PATIENT NAME:  Jill Durham, Jill Durham MR#:  [id number] DATE OF BIRTH:  12/24/1945  DATE OF ADMISSION:  06/09/2012  PRIMARY CARE PHYSICIAN: None local.  REFERRING PHYSICIAN: Dr. Darnelle Catalan   CHIEF COMPLAINT: Chest pain today.   HISTORY OF PRESENT ILLNESS: The patient is [name] with a history of syncope, atypical chest pain, back pain, hyperlipidemia, hypertension, diabetes, tobacco use, and history of seizure who presented to the ED with chest pain today. The patient developed chest pain about five hours ago which is on the left side under breast, constant, sharp, 10 out of 10 with back pain. The patient also has mild shortness of breath and cough but no hemoptysis. The patient denies any palpitations. No orthopnea. No nocturnal dyspnea. No leg edema. The patient's D-dimer is elevated at 3.44. Troponin is less than 0.02 for two sets. The patient denies any recent travel history. Dr. Darnelle Catalan, ED physician, could not get a CT angio for the patient due to difficulty getting IV access. The patient is admitted for chest pain, rule out PE. Dr. Darnelle Catalan contacted Radiology and he was informed that the patient will get a V/Q scan tomorrow morning.   PAST MEDICAL HISTORY: As mentioned above:  1. Hypertension.  2. Hyperlipidemia.  3. Diabetes.  4. Smoking. 5. Atypical chest pain. 6. Syncope.   SOCIAL HISTORY: Smokes 1 pack a day for more than 20 years. Denies any alcohol drinking or illicit drugs   PAST SURGICAL HISTORY:  1. Bladder tack surgery. 2. Partial hysterectomy.  3. Laparotomy.  4. Aortic surgery in 2002.   FAMILY HISTORY: Mother had diabetes and heart problems. Father had cancer. Her son has non-Hodgkin's lymphoma.  REVIEW OF SYSTEMS: CONSTITUTIONAL: The patient denies any fever or chills. No headache or dizziness. No weight loss. EYES: No double vision or blurred vision. ENT: No epistaxis, postnasal drip, slurred speech, or dysphagia. RESPIRATORY: Positive for cough,  shortness of breath. No wheezing or hemoptysis. CARDIOVASCULAR: Positive for chest pain but no palpitations, orthopnea, or nocturnal dyspnea. No leg edema. GI: Positive for nausea but no vomiting, diarrhea, or abdominal pain. No hematemesis. No melena or bloody stool. GU: No dysuria or hematuria but has incontinence. ENDOCRINE: No polyuria, polydipsia, heat or cold intolerance. HEMATOLOGY: No easy bruising or bleeding. NEUROLOGY: No syncope, loss of consciousness, or seizure. MUSCULOSKELETAL: No joint pain. No edema. PSYCHIATRIC: Has anxiety but no depression.   HOME MEDICATIONS:  1. Omeprazole 40 mg p.o. daily.  2. Metformin 1000 mg p.o. b.i.d.  3. Lantus 20 units sub-Q at bedtime. 4. Enalapril 5 mg p.o. daily.   5. Carafate 1 gram p.o. 4 times a day. 6. Buspirone 10 mg p.o. b.i.d.  7. Aspirin 81 mg p.o. daily.  8. Amitriptyline 150 mg p.o. daily.   PHYSICAL EXAMINATION:   VITAL SIGNS: Temperature 97.6, blood pressure 177/88, pulse 84, respirations 18, oxygen saturation 98% on room air.   GENERAL: The patient is alert, awake, oriented in no acute distress.   HEENT: Pupils round, equal, reactive to light and accommodation. Moist oral mucosa. Clear oropharynx. No discharge from ear or nose.   NECK: Supple. No JVD or carotid bruits. No lymphadenopathy. No thyromegaly.   CARDIOVASCULAR: S1, S2 regular rate and rhythm. No murmurs or gallops.   CHEST WALL: No tenderness. No use of accessory muscles to breathe.   ABDOMEN: Soft. No distention or tenderness. No organomegaly. Bowel sounds present.   EXTREMITIES: No edema, clubbing, or cyanosis. No calf tenderness. Strong bilateral pedal pulses.   SKIN:  No rash or jaundice.   NEUROLOGIC: Alert and oriented x3. No focal deficit. Power 5/5. Sensation intact. Deep tendon reflexes 2+.    LABORATORY, DIAGNOSTIC, AND RADIOLOGICAL DATA: Troponin less than 0.02 twice. D-dimer 3.44. CBC normal. Glucose 90, BUN 14, creatinine 0.78. Electrolytes normal.  CK 74. CK-MB 0.8.   Chest x-ray showed mildly increased lung markings in the left lung base laterally.   EKG normal sinus rhythm at 86 beats per minute.   IMPRESSION:  1. Chest pain with elevated D-dimer, need to rule out PE.  2. Accelerated hypertension.  3. Diabetes.  4. Hyperlipidemia.  5. Tobacco abuse.   PLAN OF TREATMENT:  1. The patient will be admitted to tele floor.   2. We will start Lovenox sub-Q q.12 hours. 3. We will get V/Q scan in a.m.   4. We will continue aspirin and enalapril.  5. For diabetes we will start sliding scale but hold metformin.  6. Smoking cessation. Was counseled about six minutes.   Discussed the patient's situation and the plan of treatment with the patient and the patient's husband.   TIME SPENT: About 55 minutes.   ____________________________ Shaune PollackQing Jamecia Lerman, MD qc:drc D: 06/09/2012 20:36:20 ET T: 06/10/2012 08:33:28 ET JOB#: [id number]  cc: Shaune PollackQing Glennie Rodda, MD, <Dictator> Shaune PollackQING Duron Meister MD ELECTRONICALLY SIGNED 06/13/2012 17:03

## 2015-01-23 NOTE — Discharge Summary (Signed)
PATIENT NAME:  Jill Durham, Jill Durham MR#:  [id number] DATE OF BIRTH:  January 05, 1946  DATE OF ADMISSION:  01/10/2013 DATE OF DISCHARGE:  01/12/2013  For a detailed note, please take a look at the history and physical done on admission by Dr. Winona LegatoVaickute.   DIAGNOSES AT DISCHARGE:  Is as follows:  Chest pain, likely related to anxiety, headache, numbness and tingling, also possibly related to underlying anxiety/stress.  Diabetes, hypertension, anxiety/depression, history of previous transient ischemic attacks.   DIET:  The patient is being discharged on a low-fat, low-sodium, carb-controlled diet.   ACTIVITY:  As tolerated.   FOLLOW-UP:  With patient[facility]Durham [facility] in the next 1 to 2 weeks.   DISCHARGE MEDICATIONS:  Aspirin 81 mg daily, omeprazole 40 mg twice daily, doxepin 25 mg 3 times daily as needed, enalapril 5 mg daily, Lantus 20 units daily, metformin 1000 mg twice daily, amitriptyline 150 mg at bedtime, Lexapro 5 mg daily and Klonopin 0.25 mg twice daily.   CONSULTANTS DURING THE HOSPITAL COURSE:  Dr. Toni Amendlapacs, from psychiatry.   PERTINENT STUDIES DONE DURING THE HOSPITAL COURSE:  CT scan of the head done without contrast on admission showing no acute intracranial process.  A nuclear medicine myocardial scan done on April 11th showing no evidence of any acute reversible myocardial ischemia.  A normal scan.  A chest x-ray done on admission showing no acute cardiopulmonary disease.   BRIEF HOSPITAL COURSE:  This is a 69 year old female with medical problems as mentioned above, presented to the hospital on 01/10/2013 secondary to some chest pain and also some right-sided arm numbness and tingling.  1.  Chest pain.  Most likely cause of patient'Durham chest pain is probably anxiety/stress mediated.  The patient was observed on telemetry, had three sets of cardiac markers, which were negative.  She underwent a myocardial scan the day after admission which showed no evidence of an  acute ischemia.  She is currently chest pain-free and hemodynamically stable, therefore being discharged home.  Most likely the cause of her chest pain was probably anxiety/stress mediated.  2.  Right-sided numbness and tingling.  The patient was recently admitted to the hospital with similar symptoms about a month or so ago.  She had an extensive work-up for stroke including an MRI of the brain which was negative.  She had a repeat CT during this hospitalization which was negative too.  Her symptoms have not resolved.  Unlikely this is related to TIA, possibly related to DJD in her neck as she is complaining of some neck pain or underlying anxiety.  She can continue to have follow-up with the neurologist as an outpatient as she does not need any acute neurologic evaluation presently.  Since her symptoms have resolved she currently is being discharged on her aspirin and follow up with neurology as an outpatient.   3.  Diabetes.  The patient will continue her metformin and Lantus as stated.  There was no evidence of any hypo or hyperglycemia.  4.  Hypertension.  The patient remained hemodynamically stable.  She will continue enalapril as stated.  5.  Anxiety/depression.  Most likely cause of patient'Durham numbness, tingling and also chest pain was anxiety mediated.  We did obtain a psychiatry consult.  The patient was seen by Dr. Toni Amendlapacs.  He started the patient on some Lexapro and Klonopin along with amitriptyline, therefore she is being discharged on those meds as stated.   CODE STATUS:  THE PATIENT IS A FULL CODE.  TIME SPENT ON DISCHARGE:  Is 40 minutes.    ____________________________ Rolly Pancake. Cherlynn Kaiser, MD vjs:ea D: 01/12/2013 15:51:24 ET T: 01/13/2013 06:28:29 ET JOB#: [id number]  cc: Rolly Pancake. Cherlynn Kaiser, MD, <Dictator> [facility] [place] Siren MD ELECTRONICALLY SIGNED 01/17/2013 21:33

## 2015-01-23 NOTE — H&P (Signed)
PATIENT NAME:  Jill Durham, Jill Durham MR#:  [id number] DATE OF BIRTH:  06-21-1946  DATE OF ADMISSION:  12/20/2012  ADDENDUM TO THE HISTORY AND PHYSICAL DONE:   Please note, the patient was counseled regarding tobacco cessation for 5 minutes. I told her the benefits of stopping smoking and the risk of continuing to smoke. The patient was also offered nicotine patch. She states that she had used nicotine patches in the past and has not helped. I strongly recommended she stop smoking.   ____________________________ Lacie ScottsShreyang H. Allena KatzPatel, MD shp:cc D: 12/20/2012 18:49:18 ET T: 12/20/2012 19:16:19 ET JOB#: [id number]  cc: Finnlee Silvernail H. Allena KatzPatel, MD, <Dictator> Charise CarwinSHREYANG H Mychal Decarlo MD ELECTRONICALLY SIGNED 12/21/2012 11:32

## 2015-01-23 NOTE — H&P (Signed)
PATIENT NAME:  Jill Durham, Jill Durham MR#:  [id number] DATE OF BIRTH:  08/02/1946  DATE OF ADMISSION:  01/10/2013  PRIMARY CARE PHYSICIAN: Moss Mc, MD at [facility]  HISTORY OF PRESENT ILLNESS: The patient is [name] with history of a recent admission for slurred speech which was felt to be TIA, presents back to the hospital with complaints of not feeling well.  Apparently, she started not feeling well yesterday in the evening.  She was not able to eat well or sleep.  She could not get her coat on.  She was somewhat dizzy as well as weak in her legs.  Today she started noticing tingling in the right side of her body, including numbness in her right face as well as tingling in her right upper as well as lower extremity.  This afternoon, she was also having chest pains which started around 2:00 p.m. in the left under the breast area.  The pain was described as achy, constant pain with no significant change with walking, breathing or food intake and no radiation.  She had very similar pains in the past, and they were felt to be anxiety-related.  She was seen by her primary care physician on Monday, 3 days ago, and no changes were made.  She decided to come to the Emergency Room for further evaluation.  In the Emergency Room, she had her EKG done which revealed inferior infarct.  Q waves were noted in the past in March 2014 in lead III; however, now it is also present in aVF.  Hospitalist services were contacted for admission.  The patient also had a CT scan of her head today on 01/10/2013 which was unremarkable.    PAST MEDICAL HISTORY:  Significant for history of admission in March 2014 which was felt to be TIA likely.  At that time, the patient presented with slurry speech, lower extremity weakness which lasted approximately 2 hours.  Also, history of diabetes mellitus with a hemoglobin A1c most recent 8.0, diabetic neuropathy, hyperlipidemia, gastroesophageal reflux disease, history of coronary  artery disease, status post myocardial infarction and stent placement in the past, anxiety, hypertension, tobacco use and abuse.  She still continues tobacco. History of atypical chest pains in the past. History of syncope in the past. History of a-fib, was on Coumadin but taken off due to syncopal episodes.  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  History of recurrent panic attacks.   MEDICATIONS:  The patient was discharged on Lantus 20 units subcutaneous at bedtime, Metformin 1 gram twice daily, Aggrenox 1 tablet twice daily, omeprazole 40 mg p.o. twice daily, enalapril 5 mg p.o. twice daily, pregabalin 50 mg p.o. at bedtime, Doxepin 25 mg 3 times daily as needed, Nystatin 100,000 units, 5 mL every 6 hours as needed, atorvastatin 20 mg p.o. daily.  Apparently, the patient was also given aspirin 81 mg once daily dose, nicotine oral inhaler was given up to 12 times daily as needed, nicotine transdermal patch 21 mg topically daily.  ALLERGIES:  ERYTHROMYCIN AS WELL AS MORPHINE.  PAST SURGICAL HISTORY:  Status post bladder tack, status post partial hysterectomy, status post laparotomy and aortic surgery in 2002.   SOCIAL HISTORY:  She continues to smoke at least 1/2 pack a day.  She lives with her husband.  No alcohol or drug abuse.   FAMILY HISTORY:  The patient's mother had diabetes and heart problems.  Father had cancer.  REVIEW OF SYSTEMS: Positive for  chest pains, right-sided numbness and tingling, some cold last night, fatigued and weak, pains in the chest, some cough intermittently with little phlegm production which is green color, nausea episodes, numbness in the right face, tingling in the right arm and leg as mentioned above.   CONSTITUTIONAL: No fevers or chills, weight loss or gain.  EYES: Denies any blurry vision, double vision, glaucoma or cataracts.  EARS, NOSE AND THROAT: Denies any tinnitus, allergies, epistaxis, sinus pain, dentures, difficulty  swallowing.  RESPIRATORY: Denies any wheezes, asthma or COPD.  CARDIOVASCULAR: Denies any orthopnea, edema, palpitations or syncope. GASTROINTESTINAL: Denies any vomiting or diarrhea, rectal bleeding, change in bowel habits. GENITOURINARY: Denies dysuria, hematuria, frequency or incontinence. ENDOCRINOLOGY: Denies any polydipsia, nocturia, thyroid problems, heat or cold intolerance or thirst. HEMATOLOGIC: Denies any anemia, easy bruising, bleeding or swollen glands. SKIN: Denies any acne, rashes, lesions or change in moles. MUSCULOSKELETAL: Denies arthritis, cramps, swelling. NEUROLOGIC: Denies numbness, epilepsy or tremor. PSYCHIATRIC: Admits of anxiety.  No insomnia.  PHYSICAL EXAMINATION: VITAL SIGNS: On arrival to the hospital, temperature was 97.7, pulse 88, respiratory rate was 18, blood pressure 144/66, saturation 100% on room air.  GENERAL:  A well-developed, well-nourished, thin Caucasian female in no significant distress, lying on the stretcher.  HEENT: Her pupils are equal and reactive to light. Extraocular movements are intact.  No icterus or conjunctivitis.  Has normal hearing. No pharyngeal erythema. Mucosa is moist.  NECK: Did not reveal any masses. Supple, nontender. Thyroid not enlarged. No adenopathy. No JVD or carotid bruits bilaterally. Full range of motion.  LUNGS: Clear to auscultation in all fields. Diminished breath sounds but otherwise no wheezing, no labored inspirations or increased effort.  No dullness to percussion.  Not in overt respiratory distress.  CARDIOVASCULAR: S1, S2 appreciated. No murmurs, gallops or rubs were noted. PMI is not lateralized.  Chest is nontender to palpation, 1+ pedal pulses, no lower extremity edema, calf tenderness or cyanosis.   ABDOMEN:  Soft, minimally tender in the epigastric area as well as left upper quadrant.  On repeatedly pressing on her abdomen, the pain seemed to be disappearing.  No hepatosplenomegaly or masses were noted.   RECTAL: Deferred.  MUSCLE STRENGTH: Able to move all extremities. No cyanosis, degenerative joint disease or kyphosis. Gait is not tested.  SKIN: Skin did not reveal any rashes, lesions, erythema, nodularity, induration. LYMPH: No adenopathy in the cervical region. The patient, however, has some mild discomfort on cervical palpation.  No adenopathy in the cervical  area. NEUROLOGICAL: Cranial nerves are grossly intact. Sensory is difficult to evaluate. The patient is very anxious.  No dysarthria, no aphasia. The patient is alert and oriented to time, person and place, cooperative. Memory is good.  There is no confusion, agitation, depression noted.   LABORATORY AND RADIOLOGICAL DATA:  BMP: Glucose 153, otherwise BMP was unremarkable.  The patient's liver enzymes were unremarkable. The patient's cardiac enzymes, first set negative. CBC within normal limits with white blood cell count 9.6, hemoglobin 12.6, platelet count 294. Coagulation panel was unremarkable.   EKG revealed normal sinus rhythm with inferior infarct, age undetermined, which was already seen in prior EKG in March 2014.  No acute ST-T changes were noted.  CT scan of head without contrast 01/10/2013 showed no acute intracranial process.    ASSESSMENT AND PLAN: 1. Tingling sensation on the right side of her body:  I am not sure if we are experiencing again recurrent TIAs, which I really doubt as the patient is on good  medications.  At this point, we are going to get a neurologist involved.  2. Anxiety:  Start the patient on Zoloft.  3. Chest pain with no change in EKG:  We are going to do a Myoview stress test in the morning.  4. Diabetes:  Continue outpatient management.  5. Hyperlipidemia:  Continue outpatient management.  6. Hypertension: Outpatient medications. 7. Epigastric abdominal discomfort:  Start the patient on PPIs.   TIME SPENT:   50 minutes.   ____________________________ Jill Caperima Shericka Johnstone, MD rv:cb D: 01/10/2013  19:37:12 ET T: 01/10/2013 23:04:18 ET JOB#: [id number]  cc: Jill Caperima Nikiesha Milford, MD, <Dictator> [facility], Dr. Moss McErline Chang Callan Norden MD ELECTRONICALLY SIGNED 02/11/2013 18:17

## 2015-01-23 NOTE — Consult Note (Signed)
Present Illness 69 yo female with history of hypertension who has a history of chest pain with history of cardiac cath in 2012 with insignificant disease with less than 50% stenosis. She has presented to the er on multiple occasions with compalints of chest apin. She has had noninvasive workup including funcitonal studies, chest ct scans with no obvious eitology of her pain. She now presents again with severe mid sternal and left lateral chest pain. She has ruled out for an mi thus far. She has no ischmiia on her ekg.   Physical Exam:  GEN well nourished, disheveled   HEENT PERRL, hearing intact to voice   NECK supple  No masses   RESP normal resp effort  no use of accessory muscles   CARD Regular rate and rhythm  No murmur   ABD denies tenderness  no Adominal Mass   LYMPH negative neck   EXTR negative cyanosis/clubbing, negative edema   SKIN normal to palpation   NEURO cranial nerves intact, motor/sensory function intact   PSYCH A+O to time, place, person   Review of Systems:  Subjective/Chief Complaint midsternal and left lateral chest pain   General: No Complaints   Skin: No Complaints   ENT: No Complaints   Eyes: No Complaints   Neck: No Complaints   Respiratory: Short of breath   Cardiovascular: Chest pain or discomfort   Gastrointestinal: No Complaints   Genitourinary: No Complaints   Vascular: No Complaints   Musculoskeletal: No Complaints   Neurologic: No Complaints   Hematologic: No Complaints   Endocrine: No Complaints   Psychiatric: No Complaints   Review of Systems: All other systems were reviewed and found to be negative   Medications/Allergies Reviewed Medications/Allergies reviewed   Home Medications: Medication Instructions Status  metformin 1000 mg oral tablet 1 tab(s) orally 2 times a day Active  amitriptyline 150 mg oral tablet 1 tab(s) orally once a day (at bedtime) Active  Bayer Aspirin 325 mg oral delayed release tablet 1  tab(s) orally once a day Active  omeprazole 40 mg oral delayed release capsule 1 cap(s) orally 2 times a day Active  FLUoxetine 40 mg oral capsule 1 cap(s) orally once a day Active  Azo-Standard 1 cap(s) orally once a day Active  atorvastatin 80 mg oral tablet 1 tab(s) orally once a day (at bedtime) Active   Lab Results: Routine Chem:  22-Jul-14 13:50   Result Comment CK.TOTAL - Slight hemolysis, interpret results with  - caution.  Result(s) reported on 23 Apr 2013 at 06:01PM.  Glucose, Serum 99  BUN 16  Creatinine (comp) 0.87  Sodium, Serum 137  Potassium, Serum 4.4  Chloride, Serum 104  CO2, Serum 26  Calcium (Total), Serum 8.9  Anion Gap 7  Osmolality (calc) 275  eGFR (African American) >60  eGFR (Non-African American) >60 (eGFR values <92m/min/1.73 m2 may be an indication of chronic kidney disease (CKD). Calculated eGFR is useful in patients with stable renal function. The eGFR calculation will not be reliable in acutely ill patients when serum creatinine is changing rapidly. It is not useful in  patients on dialysis. The eGFR calculation may not be applicable to patients at the low and high extremes of body sizes, pregnant women, and vegetarians.)  Cardiac:  22-Jul-14 13:50   CK, Total 79  CPK-MB, Serum 0.8  Troponin I < 0.02 (0.00-0.05 0.05 ng/mL or less: NEGATIVE  Repeat testing in 3-6 hrs  if clinically indicated. >0.05 ng/mL: POTENTIAL  MYOCARDIAL INJURY. Repeat  testing in  3-6 hrs if  clinically indicated. NOTE: An increase or decrease  of 30% or more on serial  testing suggests a  clinically important change)  Routine UA:  22-Jul-14 15:09   Color (UA) Yellow  Clarity (UA) Hazy  Glucose (UA) Negative  Bilirubin (UA) Negative  Ketones (UA) Negative  Specific Gravity (UA) 1.006  Blood (UA) 1+  pH (UA) 7.0  Protein (UA) Negative  Nitrite (UA) Negative  Leukocyte Esterase (UA) 3+ (Result(s) reported on 23 Apr 2013 at 03:45PM.)  RBC (UA) 1 /HPF  WBC  (UA) 54 /HPF  Bacteria (UA) 2+  Epithelial Cells (UA) <1 /HPF (Result(s) reported on 23 Apr 2013 at 03:45PM.)  Routine Hem:  22-Jul-14 13:50   WBC (CBC) 9.7  RBC (CBC) 4.25  Hemoglobin (CBC) 13.4  Hematocrit (CBC) 39.7  Platelet Count (CBC) 272 (Result(s) reported on 23 Apr 2013 at 02:20PM.)  MCV 93  MCH 31.5  MCHC 33.8  RDW  14.6   EKG:  EKG NSR   Abnormal NSSTTW changes    Erythromycin: Fever  Erythrocin: Unknown  Morphine: Itching   Impression 69 yo female with history of cardiac cath in 2012 revealing 30-40% stenosis who has had multiple presentations to the er with complaints of chest pain. Funcitonal studies have been unremarkable and chest ct scans have been negative for pulmonary emboli or other chest etiology. CXR with this admission is normal. EKG is unremarkable and she has ruled out for an mi thus far. Etiology of pain is unclear. Progresison of her cad is possible. Will need to rule out signficant cad to guide further treatment options.   Plan 1. Rule out for mi 2. Conitnue current meds 3. Risk and benefits of cardiac cath were explained to the patient and she agrees to proceed. Will proceed wiht left cardiac cath in am with further recs after cardiac cath.   Electronic Signatures: Teodoro Spray (MD)  (Signed 22-Jul-14 21:37)  Authored: General Aspect/Present Illness, History and Physical Exam, Review of System, Home Medications, Labs, EKG , Allergies, Impression/Plan   Last Updated: 22-Jul-14 21:37 by Teodoro Spray (MD)

## 2015-01-23 NOTE — Consult Note (Signed)
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   Patient was seen by consultant.   Consult note dictated.   Recommend further assessment or treatment.   Orders entered.   Comments: Psychiatry: Patient seen and chart reviewed. Patient with long history of depression and anxiety. Moderate chronic depression symptoms now. Only med elevil 150mg  at night - chronic. No known SA hx. Plans to see and "counceler" [place] soon. Medically, I suggest adding Lexapro 5mg  a day and clonazepam 0.25 mg bid for now. She agrees.  Electronic Signatures: Audery Amellapacs, John T (MD)  (Signed 11-Apr-14 21:47)  Authored: Brief Consult Note   Last Updated: 11-Apr-14 21:47 by Audery Amellapacs, John T (MD)

## 2015-01-23 NOTE — H&P (Signed)
PATIENT NAME:  Jill Durham, [place] S MR#:  [id number] DATE OF BIRTH:  [birth year]  DATE OF ADMISSION:  12/20/2012  PRIMARY CARE PHYSICIAN:  [facility]   REFERRING PHYSICIAN:  Dr. Cyril LoosenKinner   CHIEF COMPLAINT: Slurred speech, right leg weakness lasting for 2 hours, now resolved.   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 Reports that over the past few days, she has not felt well. She does have a history of panic disorder and she reports that she had a panic attack for 2 days ago and then earlier today she developed feeling of heaviness on the right lower extremity without any problems with the right upper extremity. She also has slurred speech lasting for 2 hours. She came to the ED. In the ER, she had a CT scan of the head which showed no evidence of acute CVA. I am asked to admit the patient under observation for possible transient ischemic attack. The patient otherwise denies any chest pains. No shortness of breath, any fevers, chills or cough.   PAST MEDICAL HISTORY: Significant for:   1.  Hypertension.  2.  Hyperlipidemia.  3.  Diabetes.  4.  History of atypical chest pain in the past.  5.  History of syncope, recurrent in nature. She reports that sometimes it is due to blood pressure dropping with standing, other times, she just passes out.  5.  History of atrial fibrillation was on Coumadin but taken off due to her recurrent syncope.  6.  History of 5transient ischemic attacks in the past. She says her last one was multiple years ago.  7.  History of recurrent panic attacks.   ALLERGIES: ERYTHROMYCIN AND MORPHINE.   CURRENT MEDICATIONS:  Amitriptyline 150 mg at bedtime p.r.n. sleep, aspirin 81 mg 1 tab p.o. daily, bupropion 50, 1 tab p.o. b.i.d., Fioricet 1 tabs p.o. q. 4 to 6 p.r.n. Keppra 500, 1 tab p.o. b.i.d., Lantus 20 units at bedtime, metformin 100 one tabs p.o. b.i.d., omeprazole 40, 1 tab p.o. b.i.d.   PAST SURGICAL HISTORY: Status post bladder tack surgery, status post partial hysterectomy, status post laparotomy, status post aortic surgery in 2002.   SOCIAL HISTORY: Continues to smoke half pack per day. Lives with her husband, denies any alcohol or drug use.   FAMILY HISTORY: Mother had diabetes and heart problems, father had cancer.   REVIEW OF SYSTEMS:   CONSTITUTIONAL: The patient denies any fevers. Has intermittent fatigue and weakness. Denies any current pain, weight loss, no weight gain.  EYES: No blurred or double vision. No redness or inflammation. No glaucoma. No cataracts.  ENT: No tinnitus. No ear pain, no hearing loss. No seasonal or year-round allergies. No epistaxis. No difficulty swallowing.  RESPIRATORY: Denies any cough, wheezing, hemoptysis. No chronic obstructive pulmonary disease, no tuberculosis.  CARDIOVASCULAR: Denies any chest but has a history of chest pain, has a history of syncope, has a history of atrial fibrillation.  GASTROINTESTINAL: Denies any nausea, vomiting, diarrhea. No abdominal pain. No hematemesis. No melena. No changes in bowel habits.  GENITOURINARY: Denies any dysuria, hematuria, renal calculus or frequency.  ENDOCRINE:  Denies any polyuria, nocturia and does have diabetes.  HEMATOLOGIC:  Denies any anemia or easy bruisability.  SKIN: No acne. No rash.  MUSCULOSKELETAL: Denies any pain. No gout or inflammation.  NEUROLOGICAL: Has history of transient ischemic attacks x 5, denies any current numbness. No dysarthria. Her speech is currently back to normal.  PSYCHIATRIC: Has a history of panic attacks, anxiety disorder.   PHYSICAL EXAMINATION: VITAL SIGNS: Temperature 98.6,  pulse 72, respirations 18, blood pressure 106/61, O2 96%.  GENERAL: The patient is an elderly female in no acute distress.  HEENT: Head atraumatic, normocephalic. Pupils equally round, reactive to light and accommodation. There is no conjunctival pallor. No scleral icterus. Nasal exam shows no drainage or ulceration. Oropharynx is clear without any exudate.  NECK: No thyromegaly. No carotid bruits.  CARDIOVASCULAR: Regular rate and rhythm. No murmurs, rubs, clicks, or gallops. PMI is not displaced.  LUNGS: Clear to auscultation bilaterally without any rales, rhonchi or wheezing.  ABDOMEN: Soft, nontender, nondistended. Positive bowel sounds x 4.  EXTREMITIES: No clubbing, cyanosis or edema.  SKIN: No rash.  LYMPHATICS: No lymph nodes palpable.  VASCULAR: Good DP and PT pulses.  PSYCHIATRIC: Currently not anxious or depressed.  NEUROLOGICAL: Awake, alert, oriented x 3. Cranial nerves II through XII grossly intact. Strength is 5/5 in all 4 extremities, reflexes 2+, Babinski's downgoing.   LABORATORY, DIAGNOSTIC AND RADIOLOGIC DATA:  EKG normal sinus rhythm without any ST-T wave changes.   BMP: Glucose 124, BUN 13, creatinine 0.75, sodium 138, potassium 3.9, chloride 104, CO2 27, calcium 8.5. LFTs are normal. Troponin less than 0.02. WBC 8.7, hemoglobin 12.6, platelet count 294, INR 0.8. Urinalysis is nitrites negative, leukocytes negative.   CT scan of the head shows no acute intracranial processes.   ASSESSMENT AND PLAN: The patient is a 69 year old with history of recurrent transient ischemic attacks, presented with slurred speech, right-sided weakness for 2 hours, now symptoms resolved.  1.  Possible transient ischemic attack. At this time we will place her under observation, place her on telemetry. We will get carotid Doppler's and an echocardiogram of the heart. She is already aspirin with recurrence of symptoms. We will go ahead and place her on Aggrenox. We will also check a fasting lipid  panel morning.  2.  Diabetes type 2. Continue Lantus and metformin as taking at home.  3.  History of panic attacks and anxiety. We will continue amitriptyline and buspirone as taking at home.  4.  Gastroesophageal reflux disease. Continue omeprazole as taking at home.  5.  Miscellaneous.  We will place her on Lovenox for deep vein thrombosis prophylaxis.   TIME SPENT: 35 minutes spent.     ____________________________ Lacie Scotts. Allena Katz, MD shp:cc D: 12/20/2012 17:01:59 ET T: 12/20/2012 17:46:57 ET JOB#: [id number]  cc: Alioune Hodgkin H. Allena Katz, MD, <Dictator> Charise Carwin MD ELECTRONICALLY SIGNED 12/21/2012 11:31

## 2015-01-23 NOTE — Discharge Summary (Signed)
DATE OF BIRTH:  March 11, 1946  DATE OF ADMISSION:  12/20/2012  DATE OF DISCHARGE:  12/22/2012  ADMITTING DIAGNOSES:   Slurred speech, right lower extremity weakness lasting approximately 2 hours, likely transient ischemic attack.   DISCHARGE DIAGNOSES:  1.  Suspected transient ischemic attack. 2.  Diabetes mellitus, with hemoglobin A1c 8.0. 3.  Diabetic neuropathy. 4.  Hyperlipidemia. 5.  Gastroesophageal reflux disease.  6. History of coronary artery disease, status post recent myocardial infarction and stent placement, stable this admission.  7.  Anxiety.  8.  Hypertension.  9.  Recent tobacco use and abuse, and discontinuation approximately a month ago.   DISCHARGE CONDITION: Stable.   DISCHARGE MEDICATIONS: The patient is to resume her outpatient medications, which are:  1.  Lantus 20 units subcutaneous daily at bedtime.  2.  Metformin 1 gram p.o. twice daily.  3.  Aspirin 81 mg p.o. daily.  4.  Omeprazole 40 mg p.o. twice daily.  5.  Enalapril 5 mg p.o. twice daily.  6.  Pregabalin 50 mg p.o. at bedtime.  7.  Doxepin 25 mg 3 times daily as needed.  8.  Nystatin 100,000 units in 1 mL oral suspension, 5 mL every 6 hours.  9.  Atorvastatin 20 mg p.o. daily.  10.  Aspirin and dipyridamole 25/200, 1 capsule twice daily.  11. Nicotine oral inhaler  10 mg inhalation device 1 inhalation up to 12 times a day as needed.  12.  Nicotine transdermal patch 21 mg once daily.   HOME OXYGEN:  None.   DIET: 2 grams salt, low fat, low cholesterol, carbohydrate-controlled diet, regular consistency.   ACTIVITY LIMITATIONS: As tolerated.   REFERRAL: To home health physical therapy.   FOLLOWUP APPOINTMENT:  With Dr. Imogene Durham in 2 days after discharge.   CONSULTANTS: Care Management.   RADIOLOGIC STUDIES: Portable chest x-ray 20th of March 2014 showed no evidence of pneumonia. Cannot exclude minimal subsegmental atelectasis in left mid and lower lung. Followup PA and lateral would be of  value, according to radiologist. Ultrasound of carotid arteries bilateral, 28th of March 2014 revealed no hemodynamically significant carotid artery stenosis. CT scan of head without contrast 20th of March 2014 showed no acute intracranial process. Echocardiogram done on the 21st of March 2014 revealed no source of TIA or CVA, left ventricular ejection fraction by visual estimation is 55% to 60%, normal global left ventricular systolic function, borderline left ventricular hypertrophy, impaired relaxation pattern of left ventricular diastolic filling, mildly dilated left atrium, mildly elevated pulmonary arterial systolic pressures were also noted. MRI of brain without contrast on 22nd of March 2014 revealed white matter changes consistent with chronic ischemia, but no acute abnormality was noted.   HISTORY OF PRESENT ILLNESS: The patient is [name] with history of coronary artery disease, who suffered a MI approximately a month ago, presented to the hospital with slurred speech, right lower extremity weakness, which lasted approximately 2 hours. Please refer to Dr. Serita Grit Durham's admission on 12/20/2012. On arrival to the hospital, the patient's vital signs:  Temperature was 98.6, pulse was 72, respiration rate was 18, blood pressure 106/61, saturation was 96% on room air. Physical exam was unremarkable.  LABORATORY DATA:  The patient's lab data done on admission, 20th of March 2014 showed elevated glucose to 124, otherwise BMP was normal. The patient's liver enzymes were normal. The patient's cardiac enzymes were normal. The patient's CBC: White blood cell count was 8.7, hemoglobin was 12.6, platelet count 294. Coagulation panel was unremarkable. Urinalysis  revealed 2 red blood cells, 4 white blood cells, but no bacteria, no leukocyte esterase or nitrites were noted. The patient's EKG showed normal sinus rhythm at 51 beats per minute, normal axis, no acute ST-T changes were noted. CT  scan of head was also unremarkable.  HOSPITAL COURSE:  The patient was admitted to the hospital with diagnosis of TIA and workup for her suspected TIA was entertained. The patient underwent carotid ultrasound, which was unremarkable. She also had echocardiogram done, which was normal. She had MRI done as well due to recurrence of her weakness in lower extremities. However, different lower extremity, left lower extremity. It was felt that the patient would benefit from adding Aggrenox to her aspirin regimen. The patient is also to continue her anti-hyperlipidemia medications. A lipid  profile was also checked, and the LDL was found to be elevated at 142. The patient's total cholesterol was elevated at 237, triglycerides were 209, and HDL was 53. It was felt that the patient would benefit from better lipid control. However, it was felt that patient's hyperlipidemia was very likely related to poorly controlled diabetes mellitus. The patient's hemoglobin A1c was found to be 8.0. We discussed with this patient her diabetes control and made decisions about tightening her diet. She is to follow up with her primary care physician in the next few days after discharge, and make decisions about advancement of her diabetic medications if needed. For now, she is to continue metformin as well as Lantus. The patient was complaining of some tingling in her lower extremities, especially in her feet. Pregabalin was added at bedtime. She was also counseled for tobacco abuse. She told me that she would benefit from some replacement therapy, and nicotine replacement therapy was initiated for her upon discharge. In regards to anxiety, patient is to continue also her home medications. However, we added some doxepin for anxiety control as needed. For history of hypertension, the patient is to continue her outpatient management. Her blood pressure was satisfactorily controlled. On the day of discharge, patient's vital signs: Temperature  was 97.8, pulse was 72, respiration rate was 17, blood pressure 124/68, saturation was 94% on room air.   The patient is being discharged in stable condition, with above-mentioned medications and followup.   TIME SPENT:  40 minutes.     ____________________________ Katharina Caperima Arlando Leisinger, MD rv:mr D: 12/22/2012 19:04:00 ET T: 12/22/2012 20:24:34 ET JOB#: [id number]  cc: Shaune PollackQing Chen, MD Katharina Caperima Paidyn Mcferran, MD, <Dictator>    Ashleigh Arya MD ELECTRONICALLY SIGNED 01/03/2013 14:49

## 2015-01-23 NOTE — H&P (Signed)
PATIENT NAME:  Jill Durham, Jill Durham MR#:  [id number] DATE OF BIRTH:  1946/04/08  DATE OF ADMISSION:  04/23/2013  PRIMARY CARE PROVIDER: Dr. Moss Mc  EMERGENCY DEPARTMENT REFERRING PHYSICIAN: Kathreen Devoid. Paduchowski, MD  CHIEF COMPLAINT: Chest pain.   HISTORY OF PRESENT ILLNESS: The patient is a 69 year old white female with history of recurrent chest pain who has been seen in the ED with similar type of complaints on multiple occasions, and she has had multiple CT scans of the chest over the past year. She also was hospitalized here in April with a stress test Myoview that was negative. The patient had a cardiac cath done on 12/20/2010. At that time, she had a 30% LAD stenosis. There was a mid RCA 40% stenosis noted. She presents with recurrent chest pain. She reports that she has a left-sided sharp pain that is the most severe that she has experienced. She got up this morning and developed sharp pain. She also has diarrhea and associated nausea. She is also reporting shortness of breath. Prior to this, she reports that she did not have any chest pain on exertion. She did not have any nausea, vomiting or diarrhea. She denies any fevers, chills. No cough, no abdominal pain. She does complain of urinary frequency and burning.   PAST MEDICAL HISTORY: Significant for:  1.  History of TIA.  2.  History of diabetes. 3.  Diabetic neuropathy. 4.  Hyperlipidemia. 5.  GERD. 6.  History of coronary artery disease based on the cath.  7.  Anxiety.  8.  Hypertension.  9.  Tobacco abuse, continues to smoke. 10.  History of atypical chest pain. 11.  History of syncope in the past. 12.  History of A. fib, was on Coumadin, was taken off due to syncopal episode.  13.  History of panic attacks in the past.   ALLERGIES: ERYTHROMYCIN, MORPHINE.   PAST SURGICAL HISTORY: Status post bladder tack, status post partial hysterectomy, status post laparotomy and aortic surgery in 2002.   CURRENT MEDICATIONS:  Amitriptyline 150 at bedtime, atorvastatin 80 at bedtime, Azo-Standard 1 tab p.o. daily, aspirin 325 mg p.o. daily, fluoxetine 40 daily, metformin 1000 one tab p.o. b.i.d., omeprazole 40 daily.   SOCIAL HISTORY: Continues to smoke 1/2 pack per day. She lives with her husband. No alcohol or drug use.   FAMILY HISTORY: The patient's mother had diabetes and heart problem. Father had cancer.   REVIEW OF SYSTEMS:  CONSTITUTIONAL: Complains of chest pain. No fevers, chills. No weight loss. No weight gains.  EYES: No blurred or double vision. No pain. No redness. No inflammation. No glaucoma. No cataracts.  EARS, NOSE, THROAT: No tinnitus. No ear pain. No hearing loss. No seasonal or year-round allergies. No difficulty swallowing.  RESPIRATORY: Denies any cough, wheezing, hemoptysis. No COPD. No TB.  CARDIOVASCULAR: Complains of chest pain but no orthopnea. No edema. No arrhythmia.  GASTROINTESTINAL: Complains nausea and 1 episode of diarrhea. No abdominal pain. No hematemesis. No melena. No ulcer. No GERD. No IBS. No jaundice.  GENITOURINARY: Complains of frequency, urgency. ENDOCRINE: Denies any polyuria, nocturia. Denies any thyroid problems.  HEMATOLOGIC AND LYMPHATIC: Denies anemia, easy bruisability or bleeding.  SKIN: No acne. No rash. No changes in mole, hair or skin.  MUSCULOSKELETAL: Denies any pain in neck, back or shoulder. NEUROLOGIC: No numbness. Has a history of TIA.  PSYCHIATRIC: Has anxiety disorder, panic disorder.   PHYSICAL EXAMINATION: VITAL SIGNS: Temperature 98.2, pulse 76, respirations 20, blood pressure 147/67, O2 of 97% on  room air.  GENERAL: The patient is a thin Caucasian female in no significant distress.  HEENT: Pupils are equally round, reactive to light and accommodation. There is no conjunctival pallor. No scleral icterus. Nasal exam shows no drainage or ulceration. Oropharynx is clear without exudates.  NECK: Supple without JVD. Thyroid is not enlarged.  LUNGS:  Clear to auscultation bilaterally without any rales, rhonchi, wheezing.  CARDIOVASCULAR: S1, S2 positive. No murmurs, gallops. PMI is not displaced. Chest is nontender.  ABDOMEN: Soft, nontender, nondistended. Positive bowel sounds x 4. There is no hepatosplenomegaly. RECTAL: Deferred.  MUSCULOSKELETAL: There is no erythema or swelling.  SKIN: There is no rash.  LYMPHATICS: No lymph nodes palpable.  VASCULAR: Good DP, PT pulses.  PSYCHIATRIC: The patient is not anxious.  NEUROLOGIC: Cranial nerves II through XII grossly intact. Strength 5 out of 5.   LABORATORY, DIAGNOSTIC AND RADIOLOGICAL DATA: BMP: Glucose 99, BUN 16, creatinine 0.87, sodium 137, potassium 4.4, chloride 104; CO2 is 26, calcium 8.9. Troponin less than 0.02. WBC 9.7, hemoglobin 13.4, platelet count 272. 3+, WBCs 54, bacteria 2+.   Chest x-ray shows no evidence of pneumonia or acute cardiopulmonary abnormalities.   EKG: Normal sinus rhythm with some inferior Q waves.   ASSESSMENT AND PLAN: The patient is a 69 year old white female with recurrent chest pains. Has had multiple evaluations in the past, has known mild coronary artery disease based on cardiac cath in 2012. 1.  Chest pain, recurrent in nature: At this time, I have spoken to Dr. Lady GaryFath. He will see her and likely schedule her for a cath. Will start her on aspirin therapy.  2.  Diabetes: Will place her on sliding scale. Hold metformin for possible cath.  3.  Hyperlipidemia: Continue atorvastatin.  4.  Likely urinary tract infection: Will get urine cultures and start on Cipro orally.  5.  Depression, anxiety: Continue fluoxetine. 6.  Gastroesophageal reflux disease: Will continue her proton pump inhibitors. 7.  Nicotine addiction: The patient was counseled regarding smoking cessation for 4 minutes. Nicotine patch offered. The patient will be placed on nicotine patch.   TIME SPENT: 45 minutes.    ____________________________ Lacie ScottsShreyang H. Allena KatzPatel,  MD shp:jm D: 04/23/2013 17:27:21 ET T: 04/23/2013 18:08:31 ET JOB#: [id number]  cc: Adalind Weitz H. Allena KatzPatel, MD, <Dictator> Charise CarwinSHREYANG H Emmeline Winebarger MD ELECTRONICALLY SIGNED 05/06/2013 9:00

## 2015-01-23 NOTE — Discharge Summary (Signed)
PATIENT NAME:  Jill Durham, Jill Durham MR#:  [id number] DATE OF BIRTH:  03-29-1946  DATE OF ADMISSION:  04/25/2013 DATE OF DISCHARGE:  04/26/2013  CONSULTANT: Dr. Lady Gary from cardiology.   PRIMARY CARE PHYSICIAN: Dr. Moss Mc.   CHIEF COMPLAINT: Chest pain.   DISCHARGE DIAGNOSES: 1.  Chest pain, likely not cardiac, and GI related.  2.  Gastroesophageal reflux disease.  3.  Respiratory failure, likely chronic with an acute element from atelectasis and possible underlying chronic obstructive pulmonary disease.  4.  Urinary tract infection.  5.  Coronary artery disease.  6.  History of transient ischemic attack.  7.  Diabetes.  8.  Diabetic neuropathy.  9.  Hyperlipidemia.  10.  Anxiety.  11. Hypertension.  12.  Tobacco abuse.  13.  History of atypical chest pain in the past.  14.  History of atrial fibrillation 15.  History of panic attacks in the past.   DISCHARGE MEDICATIONS: Metformin 1000 mg 2 times a day, amitriptyline 150 mg once a day at bedtime, omeprazole 40 mg 2 times a day, fluoxetine 40 mg daily, atorvastatin 80 mg at bedtime, aspirin 81 mg daily, metoprolol 25 mg 1 tab 2 times a day, Levaquin 500 mg 1 tab once a day for 7 days.   DISPOSITION: She will be getting discharged with 2 liters of continuous oxygen via nasal cannula.   DIET: Low sodium, low fat, low cholesterol, ADA diet.   ACTIVITY: As tolerated.   FOLLOWUP: Please follow with your cardiologist at [facility] within 1 to 2 weeks. Please follow with PCP on Monday, as you previously scheduled and get a referral by your doctor to undergo pulmonary function tests as an outpatient.   SIGNIFICANT LABS AND IMAGING: Initial BUN 16, creatinine 0.86, sodium 137, potassium 4.4, chloride 104, serum CO2 of 26. Troponins were negative x3. LDL was 108, triglycerides 232. Initial WBC 9.7, highest WBC was 13.4, last WBC of 9.9. Initial hemoglobin was 13.4. Urine cultures grew E. coli which is pansensitive. Initial UA positive with 3+  leukocyte esterase, 54 WBC, 2+ bacteria. ABG on July 23 showed pH of 7.34, pCO2 of 50, pO2 of 82. Cardiac cath done on July 24 showed the left main being normal, LAD vessel was small to medium size and 50% stenosis, circ normal, mid RCA had a 40% stenosis. X-ray of the chest, PA and lateral, on July 22 show no evidence of pneumonia nor acute cardiopulmonary disease. There is a stable density at the left lower hemothorax laterally and a repeat x-ray on July 23rd one view, however, mid left lower lobe airspace disease, which may reflect atelectasis given the lower lung volume versus developing infiltrate.   HISTORY OF PRESENT ILLNESS AND HOSPITAL COURSE: For full details of H and P, please see the dictation by Dr. Allena Katz on July 22 but briefly, this is a 69 year old female with multiple medical issues including TIA, diabetes, hypertension and CAD, who has had a hospitalization in April but stress test that was negative. She also had a cath in 2012 that had a 30% LAD stenosis and a 40% mid RCA stenosis. She came in for left-sided chest pain, severe, some diarrhea, and also some nausea. She had some shortness of breath and got admitted to the hospitalist service and  to telemetry. She underwent cyclic cardiac markers which were negative and cardiology was consulted. Of note, initially and case was discussed with Dr. Lady Gary. The patient apparently has had a recurrent nature of this chest pain and cath was scheduled.  She was started on her sliding scale insulin and metformin was held. She was continued on her statin. She did have a positive UA and was started on Cipro orally initially. We continued her PPI. On July 23, the patient was to be taken to the catheter lab and she was there; however, the night before had received 2 doses of Dilaudid after refusing nitroglycerin. On arrival to the catheter lab she was lethargic and blood pressure in the 70s and was hypoxic. She was started on some fluids and was taken back to  telemetry. She was noted to have an ABG showing CO2 retention likely from hypoventilation. For the chest pain, she underwent a cardiac cath, the result of which is above. This was likely not cardiac chest pain as there is no significant progression of her CAD. She was discharged on her aspirin and statin. Her PPI was continued. In regards to the hypoxia, I doubt this is acute respiratory failure. She describes having spells of shortness of breath on exertion as an outpatient to the point of having presyncope. Here on ambulation, she was satting in the 70s and at rest she improved her oxygenation to the 90s. I believe underlying COPD is a strong possibility as she was a long-time smoker. She was strongly encouraged to [facility]. There is also a possibly be atelectasis acutely contributing to her hypoxia and at this time she will be discharged with 2 liters of oxygen. She was also noted to have a UTI and will be discharged on Levaquin. Currently, she is chest pain free and will be discharged to outpatient follow-up. At the time of discharge her temperature was 98.2, pulse rate of a 66, respiratory rate 20, blood pressure 116/55. O2 sat was 94% on 1 liter at rest.  Generally she is awake, alert, oriented x3, appears older than her age. She is not acutely distressed. She has normal respiratory effort and no use of accessory muscles. She does have mild crackles at the left base still. On auscultation of her heart she has no significant murmurs or significant lower extremity edema. It is regularly irregular. She has no significant abdominal tenderness to palpation. She has no significant lower extremity edema. At this time she will be [facility].   CODE STATUS:  SHE IS FULL CODE.   Total Time Spent: 35 minutes.   ____________________________ Krystal EatonShayiq Ashleyann Shoun, MD sa:dp D: 04/27/2013 08:26:35 ET T: 04/27/2013 08:51:28 ET JOB#: [id number]  cc: Krystal EatonShayiq  Aribelle Mccosh, MD, <Dictator> Dr. Moss McErline Chang Krystal EatonSHAYIQ Roseanne Juenger MD ELECTRONICALLY SIGNED 05/16/2013 13:25

## 2015-01-24 NOTE — Discharge Summary (Signed)
PATIENT NAME:  Jill Durham, Jill Durham MR#:  [id number] DATE OF BIRTH:  1945/10/09  DATE OF ADMISSION:  01/14/2014 DATE OF DISCHARGE:  01/15/2014  PRIMARY CARE PHYSICIAN: Dr. Juel Burrow.  CHIEF COMPLAINT:  Passed out.   DISCHARGE DIAGNOSES: 1.  Syncope, likely due to orthostatic hypotension also possibly secondary to accelerated hypertension.  2.  Accelerated hypertension.  3.  Diabetes.  4.  Depression.  5.  Tobacco abuse.  6.  History of transient ischemic attack/cerebrovascular accident per patient.  7.  History of atrial fibrillation in paroxysmal seizure disorder.  8.  Coronary artery disease.   DISCHARGE MEDICATIONS: Aspirin 81 mg daily, metformin 1000 mg 2 times a day, omeprazole 40 mg 2 times a day, fluoxetine 40 mg once a day, amitriptyline 75 mg at bedtime, enalapril 5 mg daily, simvastatin 20 mg daily,   DIET:  Low sodium, low fat, low cholesterol, ADA diet.   ACTIVITY: As tolerated.   FOLLOWUP: Please follow with PCP within 1 to 2 weeks.   DISPOSITION: Home.   SIGNIFICANT LABS AND IMAGING: Urinalysis on admission negative. White count of 8.4, hemoglobin 11.8, platelets 287. Troponins negative x3 and echocardiogram showing a normal ejection fraction. No significant valvular abnormalities. Ultrasound carotids bilaterally showing bilateral atherosclerotic plaque left subjectively greater than right, not resulting in hemodynamically significant stenosis CT head without contrast: No acute findings. Stable advanced small vessel disease. X-ray of the chest, one view, on admission showing left mid lung, linear atelectasis low lung volumes.   HISTORY OF PRESENT ILLNESS AND HOSPITAL COURSE: For full details of H and P, please see the dictation on 04/13 by Dr. Clint Guy, but briefly this is a pleasant 69 year old with history of diabetes, seizure disorder or CAD, who came in after she witnessed syncope in the setting of decreased p.o. intake after a couple of days with weakness and fatigue. She also  had some positional dizziness. She came into the hospital without chest pains or palpitation complaints. She was admitted to the hospitalist service and had a CT of the head which was negative for a stroke. She was started on telemetry for evaluation of any significant arrhythmias which she really did not have, except for some sinus brady. She also had an ultrasound of the carotids showing atherosclerotic plaque without significant stenosis. She was started on fluids and unfortunately no initial orthostatic vitals were taken, and the following day with fluids she was negative for orthostatics, but at that time the positional dizziness had resolved. It is unclear if the patient did have orthostatic hypotension which is possible; however, another possible contributing factor is her elevated blood pressure on arrival. She stated that at one time last year she was on enalapril. After, she lost a significant amount of weight, her pressures improved and her doctor took her off of the enalapril; however, recently she has gained about 14 or 15 pounds again and it appears that the pressures again have been creeping up. On the day of presentation, she stated that she took her blood pressure a couple of times and was in the 190 range and per our records, when she came in, the pressures were 204/79 so I suspect elevated pressure might have played a role. She was counseled about being on blood pressure medications and she will be discharged on enalapril after metoprolol caused low heart rate in the 50s, which was stopped. Nonetheless, she is also diabetic, so an ACE inhibitor is a first line. She does not appear to have any significant valvular abnormalities on  echocardiogram with normal EF and she has been ruled out for acute coronary syndrome with cyclic cardiac markers. At this point, she does not have any more dizziness, and she will be discharged and she was instructed to follow with PCP for blood pressure check in the next  1 to 2 weeks.   TOTAL TIME SPENT: Is about 34 minutes.   The patient is a FULL CODE.     ____________________________ Jill EatonShayiq Rashaad Hallstrom, MD sa:dp D: 01/15/2014 13:45:02 ET T: 01/15/2014 14:21:43 ET JOB#: [id number]  cc: Jill EatonShayiq Asa Baudoin, MD, <Dictator> Corky DownsJaved Masoud, MD Jill EatonSHAYIQ Joelee Snoke MD ELECTRONICALLY SIGNED 01/30/2014 14:27

## 2015-01-24 NOTE — H&P (Signed)
PATIENT NAME:  Jill Durham, Jill Durham MR#:  [id number] DATE OF BIRTH:  1946-08-27  DATE OF ADMISSION:  01/13/2014  REFERRING PHYSICIAN:  Lurena Joiner L. Shaune Pollack, MD  PRIMARY CARE PHYSICIAN: Dr. Dory Peru   CHIEF COMPLAINT: Syncopal episode.   HISTORY OF PRESENT ILLNESS: [name] with past medical history of diabetes, seizure disorder, coronary artery disease, presenting after a witnessed syncopal episode. She describes decreased p.o. intake for about 2 to 3 days and has associated generalized weakness and fatigue. Had a witnessed syncopal episode today when going from a sitting to standing position on her front porch. She noticed a lightheaded sensation followed by syncopal episode. Denies any chest pain, palpitations, shortness of breath or prior symptoms. She had loss of consciousness without head trauma lasting a few minutes. Denies any bowel or bladder incontinence, even though, apparently at baseline, she is incontinent of both. She is back to baseline now with no further complaints.   REVIEW OF SYSTEMS:  CONSTITUTIONAL: Denies fever or chills. Positive for fatigue, weakness.  EYES: Denied blurred vision, double vision, eye pain.  EARS, NOSE, THROAT: Denies tinnitus, ear pain, hearing loss.  RESPIRATORY: Denies cough, wheeze, shortness of breath.  CARDIOVASCULAR: Denies chest pain, palpitations, edema. Positive for syncope.  GASTROINTESTINAL: Denies nausea, vomiting, diarrhea, abdominal pain.  GENITOURINARY: No dysuria or hematuria.  ENDOCRINE: Denies nocturia or thyroid problems.  HEMATOLOGIC AND LYMPHATIC: Bruising, bleeding.  SKIN: Denies rashes or lesions.  MUSCULOSKELETAL: Denies pain in neck, back, shoulders, knees, hips or symptoms.  NEUROLOGIC: Denies paralysis, paresthesias.  PSYCHIATRIC: Positive for depressive symptoms. Denies any anxiety symptoms. Denies homicidal or suicidal ideation.   PAST MEDICAL HISTORY: Atrial fibrillation and paroxysmal seizure disorder; last seizure many  years ago; diabetes, history of TIA, coronary artery disease, hypertension, hyperlipidemia.   SOCIAL HISTORY: Positive for tobacco usage. Denies alcohol or drug usage.   FAMILY HISTORY: Positive for coronary artery disease and diabetes.   ALLERGIES: ERYTHROMYCIN AND MORPHINE.   HOME MEDICATIONS: Include aspirin 81 mg p.o. daily, amitriptyline 75 mg p.o. at bedtime, fluoxetine 40 mg p.o. daily, metformin 1000 mg p.o. b.i.d., metoprolol 25 mg p.o. b.i.d., which she apparently takes as p.r.n. medication, Prilosec 40 mg p.o. b.i.d.   PHYSICAL EXAMINATION:  VITAL SIGNS: Temperature 97.7, heart rate 79, respirations 18, blood pressure 151/67, saturating 97% on room air. Weight 63.5 kg, BMI 27.3.  GENERAL: Well nourished, however, weak -appearing Caucasian female, currently in no acute distress.  HEAD: Normocephalic, atraumatic.  EYES: Pupils equal, round, and reactive light. Extraocular muscles intact. No scleral icterus.  MOUTH: Moist mucosal membranes. Dentition intact. No abscess noted. Throat clear without exudates. No sites.  NECK: Supple. No thyromegaly. No nodules. No JVD.  PULMONARY: Clear to auscultation bilaterally without wheezes, rales or rhonchi. No accessory muscle use. Good respiratory effort.  CHEST: Nontender to palpation.  CARDIOVASCULAR: S1, S2, regular rate and rhythm. No murmurs, rubs or gallops. No edema. Pedal pulses 2+ bilaterally.  GASTROINTESTINAL: Soft, nontender, nondistended. No masses. Positive bowel sounds. No hepatosplenomegaly.  MUSCULOSKELETAL: No swelling, clubbing, or edema. Range of motion full in all extremities.  NEUROLOGIC: Cranial nerves II through XII intact. No gross focal neurological deficit. Sensation intact. Reflexes intact.  SKIN: No ulcerations, lesions, rash, cyanosis. Skin warm, dry. Turgor is intact.  PSYCHIATRIC: Mood and affect within normal limits. The patient is awake, alert, oriented x3. Insight and judgment intact.   LABORATORY DATA: EKG  performed with normal sinus rhythm. No ST or T wave abnormalities. No conduction deficits. CT head  performed, no acute intracranial process. Chest x-ray performed bibasilar atelectasis with low lung volumes. There were no acute processes. Remainder of laboratory data: Sodium 130, potassium 4.3, chloride 105, bicarbonate 28, BUN 18, creatinine 1.03, glucose 106. LFTs: Albumin 3.3, alkaline phosphatase 150, remainder within normal limits. Troponin I less than 0.02. WBC 8.4, hemoglobin 11.8, platelets 287. Urinalysis negative for evidence of infection.   ASSESSMENT AND PLAN: [name] with history of diabetes, seizure disorder, coronary artery disease. Currently, she is presenting or syncopal episode.  1.  Syncope, likely orthostatic in nature. We will check orthostatic vital signs. This has not been  performed; however, she has received some IV fluids thus far. We will continue IV fluid hydration. We will trend cardiac enzymes x3. Place on telemetry under observational status.  2.  Hypertension. Continue her Lopressor. She does not take at home.  3.  Diabetes. Hold p.o. agents. Add insulin sliding scale.  4.  Depression. Continue with her fluoxetine and amitriptyline. 5.  The patient is full code.   TIME SPENT: 45 minutes  ____________________________ Cletis Athensavid K. Hower, MD dkh:sw D: 01/13/2014 22:04:20 ET T: 01/13/2014 23:13:59 ET JOB#: [id number]  cc: Cletis Athensavid K. Hower, MD, <Dictator> DAVID Synetta ShadowK HOWER MD ELECTRONICALLY SIGNED 01/14/2014 2:50

## 2015-01-25 NOTE — Discharge Summary (Signed)
PATIENT NAME:  Jill Durham, Jill Durham MR#:  [id number] DATE OF BIRTH:  1946-02-10  DATE OF ADMISSION:  01/13/2012 DATE OF DISCHARGE:  01/13/2012  DIAGNOSES:  1. Syncope, unclear etiology, possibly related to anxiety/panic attack. 2. Chest pain, back pain very atypical, likely noncardiac possibly related to anxiety.  3. Low magnesium which was supplemented. 4. Hyperlipidemia. 5. Diabetes.  6. Possible history of seizure episode after aortic surgery. 7. Smoking.   DISPOSITION: Patient is being discharged home.   FOLLOW UP: Follow up with primary care physician, Dr. Dory Peruhung, in 1 to 2 weeks after discharge.   DIET: Low sodium, 1800 calorie ADA diet.   ACTIVITY: As tolerated.   DISCHARGE MEDICATIONS:  1. Lopressor 12.5 mg b.i.d., hold for systolic blood pressure less than 100 and heart rate less than 60.  2. Zocor 20 mg daily.  3. Xanax 0.25 mg b.i.d. p.r.n. anxiety.  4. Lantus 20 units subcutaneously once a day. 5. Aspirin 81 mg daily.  6. Metformin 1000 mg b.i.d.  7. Omeprazole 20 mg daily.  8. Amitriptyline 150 mg at bedtime.  LABORATORY, DIAGNOSTIC AND RADIOLOGICAL DATA: CT head showed no acute abnormality. CBC normal. Complete metabolic panel normal. LDL 109, hemoglobin A1c 6.9, magnesium 1.3. Cardiac enzymes negative. TSH 2.40.   HOSPITAL COURSE: Patient is a 69 year old female with past medical history of diabetes, smoking, hypertension, history of transient ischemic attacks in the past, minimal coronary artery disease with history of recurrent syncope presented with back pain, nausea, vomiting, diaphoresis and syncope. The etiology of patient'Durham syncope is unclear. She has had extensive work-up in the past including neurology work-up which have been negative. Patient'Durham husband felt that patient'Durham symptoms were related to anxiety. She has had multiple stressors recently. She was monitored on telemetry and had no events. Serial cardiac enzymes were negative. TSH was normal. CAT scan of the  head was negative. Patient felt at her baseline by the time of discharge. Patient has nonspecific chest and back pain which was possibly related to anxiety. During the hospitalization she did not have any further symptoms and cardiac enzymes were negative. She had low magnesium which was replaced. She had elevated LDL therefore has been started on statin therapy. Her diabetes remained well controlled during the hospitalization. She is a smoker, and was extensively counseled. She gave a history of seizure episode after her aortic surgery and has not had episodes in the last two years. She is not on any anticonvulsant therapy. She had no episodes of seizure during the hospitalization. Patient is being discharged home in a stable condition.   TIME SPENT: 45 minutes.   ____________________________ Darrick MeigsSangeeta Jahmiyah Dullea, MD sp:cms D: 01/13/2012 18:46:35 ET T: 01/16/2012 10:52:39 ET JOB#: [id number]  cc: Darrick MeigsSangeeta Beauford Lando, MD, <Dictator> Dr. Ishmael Holterhung Abir Craine MD ELECTRONICALLY SIGNED 01/21/2012 7:18
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
## 2015-01-26 DIAGNOSIS — F17200 Nicotine dependence, unspecified, uncomplicated: Secondary | ICD-10-CM | POA: Diagnosis not present

## 2015-01-26 DIAGNOSIS — E1143 Type 2 diabetes mellitus with diabetic autonomic (poly)neuropathy: Secondary | ICD-10-CM | POA: Diagnosis not present

## 2015-01-26 DIAGNOSIS — N393 Stress incontinence (female) (male): Secondary | ICD-10-CM | POA: Diagnosis not present

## 2015-01-26 DIAGNOSIS — R55 Syncope and collapse: Secondary | ICD-10-CM | POA: Diagnosis not present

## 2015-01-27 ENCOUNTER — Emergency Department: Admit: 2015-01-27 | Disposition: A | Discharge status: Home / Self Care | Payer: Self-pay | Admitting: Internal Medicine

## 2015-01-27 DIAGNOSIS — M5030 Other cervical disc degeneration, unspecified cervical region: Secondary | ICD-10-CM | POA: Diagnosis not present

## 2015-01-27 DIAGNOSIS — S161XXA Strain of muscle, fascia and tendon at neck level, initial encounter: Secondary | ICD-10-CM | POA: Diagnosis not present

## 2015-01-27 DIAGNOSIS — R55 Syncope and collapse: Secondary | ICD-10-CM | POA: Diagnosis not present

## 2015-01-27 DIAGNOSIS — I639 Cerebral infarction, unspecified: Secondary | ICD-10-CM | POA: Diagnosis not present

## 2015-01-27 DIAGNOSIS — Z7982 Long term (current) use of aspirin: Secondary | ICD-10-CM | POA: Diagnosis not present

## 2015-01-27 DIAGNOSIS — M549 Dorsalgia, unspecified: Secondary | ICD-10-CM | POA: Diagnosis not present

## 2015-01-27 DIAGNOSIS — Z794 Long term (current) use of insulin: Secondary | ICD-10-CM | POA: Diagnosis not present

## 2015-01-27 DIAGNOSIS — J984 Other disorders of lung: Secondary | ICD-10-CM | POA: Diagnosis not present

## 2015-01-27 DIAGNOSIS — Z79899 Other long term (current) drug therapy: Secondary | ICD-10-CM | POA: Diagnosis not present

## 2015-01-27 DIAGNOSIS — I10 Essential (primary) hypertension: Secondary | ICD-10-CM | POA: Diagnosis not present

## 2015-01-27 DIAGNOSIS — W19XXXA Unspecified fall, initial encounter: Secondary | ICD-10-CM | POA: Diagnosis not present

## 2015-01-27 DIAGNOSIS — S1093XA Contusion of unspecified part of neck, initial encounter: Secondary | ICD-10-CM | POA: Diagnosis not present

## 2015-01-27 DIAGNOSIS — M47812 Spondylosis without myelopathy or radiculopathy, cervical region: Secondary | ICD-10-CM | POA: Diagnosis not present

## 2015-01-27 LAB — COMPREHENSIVE METABOLIC PANEL
ANION GAP: 5 — AB (ref 7–16)
Albumin: 3.7 g/dL
Alkaline Phosphatase: 74 U/L
BILIRUBIN TOTAL: 0.3 mg/dL
BUN: 11 mg/dL
CALCIUM: 9.9 mg/dL
CHLORIDE: 111 mmol/L
CO2: 25 mmol/L
CREATININE: 0.76 mg/dL
EGFR (Non-African Amer.): >60
GLUCOSE: 115 mg/dL — AB
Potassium: 3.9 mmol/L
SGOT(AST): 25 U/L
SGPT (ALT): 16 U/L
Sodium: 141 mmol/L
Total Protein: 6.3 g/dL — ABNORMAL LOW

## 2015-01-27 LAB — TROPONIN I: Troponin-I: <0.03 ng/mL

## 2015-01-27 LAB — CBC
HCT: 33.5 % — AB (ref 35.0–47.0)
HGB: 11.2 g/dL — AB (ref 12.0–16.0)
MCH: 29.1 pg (ref 26.0–34.0)
MCHC: 33.3 g/dL (ref 32.0–36.0)
MCV: 87 fL (ref 80–100)
Platelet: 256 10*3/uL (ref 150–440)
RBC: 3.83 10*6/uL (ref 3.80–5.20)
RDW: 16.4 % — AB (ref 11.5–14.5)
WBC: 8.8 10*3/uL (ref 3.6–11.0)

## 2015-02-01 NOTE — Consult Note (Signed)
PATIENT NAME:  Jill Durham, Jill Durham MR#:  [id number] DATE OF BIRTH:  [birth year]  DATE OF CONSULTATION:  10/09/2014  REFERRING PHYSICIAN:  Vipul Durham. Sherryll BurgerShah, MD CONSULTING PHYSICIAN:  Marcina MillardAlexander Irwin Toran, MD  PRIMARY CARE PHYSICIAN: Corky DownsJaved Masoud, MD  CHIEF COMPLAINT: I passed out.   REASON FOR CONSULTATION: A consultation requested for evaluation of syncope.   HISTORY OF PRESENT ILLNESS: The patient is a 69 year old female with history of atrial fibrillation and prior seizure disorder. She reports a 3 month history of intermittent episodes of syncope. She reports that she recently had a stress test per Dr. Harl BowieMassoud which reportedly was negative. She presents with a 2 week history of recurrent episodes of passing out. Her husband noted that she had 3 episodes the day of admission. [facility] where EKG was nondiagnostic. The patient has ruled out for myocardial infarction by CPK, isoenzymes, and troponin. She has remained in sinus rhythm on telemetry. She has had no recurrent syncope following hospitalization.   PAST MEDICAL HISTORY: 1.  Atrial fibrillation.  2.  Seizure disorder.  3.  Hypertension.  4.  Hyperlipidemia.   MEDICATIONS: Aspirin 81 mg daily, amlodipine 5 mg at bedtime, enalapril 5 mg daily, alprazolam 0.25 mg b.i.d. p.r.n., fluoxetine 40 mg at bedtime, insulin Lantus 20 units subcutaneous at bedtime, loratadine 10 mg at bedtime, nortriptyline 75 mg at bedtime, omeprazole 40 mg b.i.d., tramadol 50 mg b.i.d.   SOCIAL HISTORY: The patient smokes 1/2 pack of cigarettes a day.   FAMILY HISTORY: Grandmother with a history of myocardial infarction.   REVIEW OF SYSTEMS: CONSTITUTIONAL: No fever or chills.  EYES: No blurry vision.  EARS: No hearing loss.  RESPIRATORY: No shortness of breath.  CARDIOVASCULAR: The patient has some intermittent chest tightness with recurrent syncope.  GASTROINTESTINAL: The patient had an episode of vomiting yesterday.  GENITOURINARY:  No dysuria or hematuria.  ENDOCRINE: No polyuria or polydipsia.  MUSCULOSKELETAL: No arthralgias or myalgias.  NEUROLOGICAL: No focal muscle weakness. No numbness. The patient has had a history of a seizure disorder.   PHYSICAL EXAMINATION: VITAL SIGNS: Blood pressure 175/75, pulse 74, respirations 20, temperature 98, pulse oximetry 95%.  HEENT: Pupils equal, reactive to light and accommodation.  NECK: Supple without thyromegaly.  LUNGS: Clear. CARDIOVASCULAR: Normal JVP. Normal PMI. Regular rate and rhythm. Normal S1, S2. No appreciable gallop, murmur, or rub.  ABDOMEN: Soft and nontender. Pulses were intact bilaterally.  MUSCULOSKELETAL: Normal muscle tone.  NEUROLOGIC: The patient is alert and oriented x 3. Motor and sensory both grossly intact.   IMPRESSION: A 69 year old female who presents with recurrent syncope with atypical features, EKG nondiagnostic. The patient has remained clinically hemodynamically stable following admission.   RECOMMENDATIONS: 1.  Agree with overall current therapy.  2.  Continue to monitor on telemetry.  3.  Review 2-D echocardiogram.  4.  Would defer repeat functional studies since she recently had one as an outpatient.  5.  Further recommendations pending the patient'Durham initial clinical course and echocardiographic findings.     ____________________________ Marcina MillardAlexander Laini Urick, MD ap:at D: 10/09/2014 13:08:55 ET T: 10/09/2014 14:14:10 ET JOB#: [id number]  cc: Marcina MillardAlexander Jelesa Mangini, MD, <Dictator> Marcina MillardALEXANDER Shirlie Enck MD ELECTRONICALLY SIGNED 10/22/2014 18:05

## 2015-02-01 NOTE — Discharge Summary (Signed)
PATIENT NAME:  Jill Durham, [name] MR#:  [id number] DATE OF BIRTH:  07-14-1946  DATE OF ADMISSION:  10/08/2014 DATE OF DISCHARGE:  10/10/2014  PRIMARY CARE PHYSICIAN: Corky DownsJaved Masoud, MD   FINAL DIAGNOSES:  1.  Syncope, unclear etiology.  2.  Hypertension.  3.  Diabetes type 2.  4.  Anxiety/depression.  5.  Chronic kidney disease, stage III.   MEDICATIONS ON DISCHARGE INCLUDE: Omeprazole 40 mg twice a day, loratadine 10 mg at bedtime, aspirin 81 mg daily, nortriptyline 75 mg at bedtime, amlodipine 5 mg at bedtime, fluoxetine 40 mg at bedtime, enalapril 5 mg at bedtime, Xanax 0.25 mg twice a day as needed for anxiety, Lantus decreased to 8 units subcutaneous injection in the evening, Fioricet 1 tablet every 8 hours as needed for headache. Stop taking tramadol.   DISCHARGE INSTRUCTIONS: Outpatient physical therapy recommended. Dr. Marcina MillardAlexander Paraschos recommended a loop recorder; I wrote a script for that; results to go to Dr. Darrold JunkerParaschos and Dr. Juel BurrowMasoud.   FOLLOWUP: With Dr. Juel BurrowMasoud 1-2 weeks.   HOSPITAL COURSE: The patient was admitted 10/08/2014 and discharged 10/10/2014, seen in consultation by Dr. Mellody DrownMatthew Smith of neurology and Dr. Marcina MillardAlexander Paraschos of cardiology.   LABORATORY AND RADIOLOGICAL DATA DURING THE HOSPITAL COURSE INCLUDED: EKG: Normal sinus rhythm, left ventricular hypertrophy, RPR nonreactive. Laboratories: Vitamin B12 604. TSH 1.82. Urinalysis was negative. PT 11.1, INR 0.8, BNP 498. Glucose 184, BUN 14, creatinine 1.47, sodium 138, potassium 4.0, chloride 103, CO2 of 27, calcium 8.3. White blood cell count 9.1, H and H 11.6 and 35.9, platelet count of 324,000.  Chest X-ray: Negative. CT Scan of the Head: Negative. Remote lacunar infarcts in the right globus pallidus and caudate head nuclei. Troponins were negative. Echocardiogram: Showed EF 60-65%, impaired relaxation and moderate left ventricular hypertrophy,  mildly dilated left and right atrium. Right Wrist X-ray: Negative.  Creatinine upon Discharge: 1.41.   HOSPITAL COURSE PER PROBLEM LIST:  1.  Syncope. Cardiac work-up here in the hospital was negative. EEG was negative, as per Dr. Katrinka BlazingSmith. Dr. Katrinka BlazingSmith recommended stopping the tramadol also, which I already stopped, and using Fioricet as needed for headache. Another possible thing is the sugars could be a low; he did not have a hypoglycemic episode here, but I decreased the Lantus insulin down to 8 units. The patient felt a lot better upon discharge and was discharged home in stable condition.  2.  Hypertension. The patient was not orthostatic. Initially the enalapril was held, but put back upon discharge.  3.  Diabetes type 2. I decreased the Lantus to 8 units subcutaneous injection at night.  4.  Anxiety/depression. I try to hold off on the Xanax as much as possible. She is on nortriptyline and fluoxetine.  5.  Chronic kidney disease, stage III. This is chronic, despite IV fluid hydration.   TIME SPENT ON DISCHARGE: 35 minutes.    ____________________________ Herschell Dimesichard J. Renae GlossWieting, MD rjw:MT D: 10/10/2014 15:20:31 ET T: 10/10/2014 15:48:46 ET JOB#: [id number]  cc: Herschell Dimesichard J. Renae GlossWieting, MD, <Dictator> Corky DownsJaved Masoud, MD Salley ScarletICHARD J Jelitza Manninen MD ELECTRONICALLY SIGNED 10/12/2014 15:15

## 2015-02-01 NOTE — Consult Note (Signed)
Referring Physician:  Remer Macho :   Primary Care Physician:  [facility], [address] [place], [place] [address], [phone]  Reason for Consult: Admit Date: 08-Oct-2014  Chief Complaint: passing out  Reason for Consult: seizure   History of Present Illness: History of Present Illness:   69 yo RHD F presents to [facility] after passing out multiple times.  Pt reports passing out 4x yesterday.  Pt had another episode today as well.  Pt reports that she feels hot, nauseated, numbness around her lips prior.  She can predict when she is going to pass out.  She has baseline incontinence so does not report any increase with episodes.  She denies tongue biting.  Pt does have remote hx of seizures in the past and son states that these are different because she does not shake like prior seizures.  She does report a headache after some episodes and a 8/10 headache now.  ROS:  General denies complaints   HEENT no complaints   Lungs no complaints   Cardiac no complaints   GI no complaints   GU no complaints   Musculoskeletal no complaints   Extremities no complaints   Skin no complaints   Neuro headache   Endocrine no complaints   Past Medical/Surgical Hx:  Depression:   Anxiety:   TIA - Transient Ischemic Attack:   Hypertension:   Atrial Fibrillation:   GERD:   tia's:   htn:   Diabetes:   Seizures:   MI - Myocardial Infarct:   Past Medical/ Surgical Hx:  Past Medical History reviewed by me as above   Past Surgical History reviewed by me as above   Home Medications: Medication Instructions Last Modified Date/Time  omeprazole 40 mg oral delayed release capsule 1 cap(s) orally 2 times a day 06-Jan-16 20:02  loratadine 10 mg oral tablet 1 tab(s) orally once a day (at bedtime) 06-Jan-16 20:02  aspirin 81 mg oral tablet 1 tab(s) orally once a day (in the morning) 06-Jan-16 20:02  ALPRAZolam 0.25 mg oral tablet 1 tab(s) orally 2 times a day, As  Needed - for Anxiety, Nervousness 06-Jan-16 20:02  nortriptyline 75 mg oral capsule 1 cap(s) orally once a day (at bedtime) 06-Jan-16 20:02  amLODIPine 5 mg oral tablet 1 tab(s) orally once a day (at bedtime) 06-Jan-16 20:02  FLUoxetine 40 mg oral capsule 1 cap(s) orally once a day (at bedtime) 06-Jan-16 20:02  traMADol 50 mg oral tablet 1 tab(s) orally 2 times a day, As Needed - for Pain 06-Jan-16 20:02  enalapril 5 mg oral tablet 1 tab(s) orally once a day (at bedtime) 06-Jan-16 20:02  Lantus Solostar Pen 100 units/mL subcutaneous solution 20 unit(s) subcutaneous once a day (in the evening) 06-Jan-16 20:02   Allergies:  Erythromycin: Fever  Erythrocin: Unknown  Morphine: Itching  Allergies:  Allergies as above   Social/Family History: Employment Status: retired  Lives With: children  Living Arrangements: house  Social History: no tob, no EtOh, no illicits  Family History: no seizures, no stroke   Vital Signs: **Vital Signs.:   07-Jan-16 18:58  Vital Signs Type Post Fall  Temperature Temperature (F) 98.6  Celsius 37  Temperature Source oral  Pulse Pulse 73  Respirations Respirations 20  Systolic BP Systolic BP 163  Diastolic BP (mmHg) Diastolic BP (mmHg) 77  Mean BP 104  Pulse Ox % Pulse Ox % 93  Pulse Ox Activity Level  At rest  Oxygen Delivery Room Air/ 21 %  Physical Exam: General: thin, NAD  HEENT: normocephalic, sclera nonicteric, oropharynx clear  Neck: supple, no JVD, no bruits  Chest: CTA B, no wheezing, good movement  Cardiac: RRR, no murmurs, no edema, 2+ pulses  Extremities: no C/C/E, FROM   Neurologic Exam: Mental Status: alert and oriented x 3, normal speech and language, follows complex commands  Cranial Nerves: PERRLA, EOMI, nl VF, face symmetric, tongue midline, shoulder shrug equal  Motor Exam: 5/5 B normal, tone, no tremor  Deep Tendon Reflexes: 2+/4 B, plantars downgoing B, no Hoffman  Sensory Exam: pinprick, temperature, and vibration intact  B  Coordination: FTN and HTS WNL, nl RAM   Lab Results: Thyroid:  07-Jan-16 03:29   Thyroid Stimulating Hormone 2.47 (0.45-4.50 (IU = International Unit)  ----------------------- Pregnant patients have  different reference  ranges for TSH:  - - - - - - - - - -  Pregnant, first trimetser:  0.36 - 2.50 uIU/mL)  LabObservation:  07-Jan-16 08:52   OBSERVATION Reason for Test  Routine Micro:  06-Jan-16 18:02   Micro Text Report URINE CULTURE   COMMENT                   COLONIES TOO SMALL TO READ   ANTIBIOTIC                       Specimen Source CLEAN CATCH  Culture Comment COLONIES TOO SMALL TO READ  Result(s) reported on 09 Oct 2014 at 10:43AM.  Routine Chem:  06-Jan-16 18:02   B-Type Natriuretic Peptide ([facility])  498 (Result(s) reported on 08 Oct 2014 at 06:44PM.)  07-Jan-16 03:29   Glucose, Serum  107  BUN 13  Creatinine (comp)  1.34  Sodium, Serum 139  Potassium, Serum 4.0  Chloride, Serum 107  CO2, Serum 27  Calcium (Total), Serum  7.9  Anion Gap  5  Osmolality (calc) 278  eGFR (African American)  50  eGFR (Non-African American)  42 (eGFR values <68m/min/1.73 m2 may be an indication of chronic kidney disease (CKD). Calculated eGFR, using the MRDR Study equation, is useful in  patients with stable renal function. The eGFR calculation will not be reliable in acutely ill patients when serum creatinine is changing rapidly. It is not useful in patients on dialysis. The eGFR calculation may not be applicable to patients at the low and high extremes of body sizes, pregnant women, and vegetarians.)  Magnesium, Serum  1.7 (1.8-2.4 THERAPEUTIC RANGE: 4-7 mg/dL TOXIC: > 10 mg/dL  -----------------------)  Cardiac:  07-Jan-16 03:29   Troponin I < 0.02 (0.00-0.05 0.05 ng/mL or less: NEGATIVE  Repeat testing in 3-6 hrs  if clinically indicated. >0.05 ng/mL: POTENTIAL  MYOCARDIAL INJURY. Repeat  testing in 3-6 hrs if  clinically indicated. NOTE: An increase or  decrease  of 30% or more on serial  testing suggests a  clinically important change)  Routine UA:  06-Jan-16 18:02   Color (UA) Straw  Clarity (UA) Clear  Glucose (UA) Negative  Bilirubin (UA) Negative  Ketones (UA) Negative  Specific Gravity (UA) 1.009  Blood (UA) Negative  pH (UA) 5.0  Protein (UA) Negative  Nitrite (UA) Negative  Leukocyte Esterase (UA) Negative (Result(s) reported on 08 Oct 2014 at 06:48PM.)  RBC (UA) <1 /HPF  WBC (UA) 3 /HPF  Bacteria (UA) 3+  Epithelial Cells (UA) <1 /HPF (Result(s) reported on 08 Oct 2014 at 06:48PM.)  Routine Coag:  06-Jan-16 18:02   Prothrombin  11.1  INR 0.8 (INR  reference interval applies to patients on anticoagulant therapy. A single INR therapeutic range for coumarins is not optimal for all indications; however, the suggested range for most indications is 2.0 - 3.0. Exceptions to the INR Reference Range may include: Prosthetic heart valves, acute myocardial infarction, prevention of myocardial infarction, and combinations of aspirin and anticoagulant. The need for a higher or lower target INR must be assessed individually. Reference: The Pharmacology and Management of the Vitamin K  antagonists: the seventh ACCP Conference on Antithrombotic and Thrombolytic Therapy. XFGHW.2993 Sept:126 (3suppl): [id number]. A HCT value >55% may artifactually increase the PT.  In one study,  the increase was an average of 25%. Reference:  "Effect on Routine and Special Coagulation Testing Values of Citrate Anticoagulant Adjustment in Patients with High HCT Values." American Journal of Clinical Pathology 2006;126:400-405.)  Routine Hem:  07-Jan-16 03:29   WBC (CBC) 8.1  RBC (CBC)  3.75  Hemoglobin (CBC)  11.2  Hematocrit (CBC) 35.1  Platelet Count (CBC) 283  MCV 94  MCH 29.8  MCHC  31.8  RDW  15.8  Neutrophil % 46.7  Lymphocyte % 40.3  Monocyte % 8.8  Eosinophil % 3.2  Basophil % 1.0  Neutrophil # 3.8  Lymphocyte # 3.3  Monocyte #  0.7  Eosinophil # 0.3  Basophil # 0.1 (Result(s) reported on 09 Oct 2014 at 03:58AM.)   Radiology Results: CT:    06-Jan-16 19:33, CT Head Without Contrast  CT Head Without Contrast   REASON FOR EXAM:    syncope twice with unstable gait for 24 hours  COMMENTS:       PROCEDURE: CT  - CT HEAD WITHOUT CONTRAST  - Oct 08 2014  7:33PM     CLINICAL DATA:  Syncope.  Headache.    EXAM:  CT HEAD WITHOUT CONTRAST    TECHNIQUE:  Contiguous axial images were obtained from the base of the skull  through the vertex without intravenous contrast.    COMPARISON:  01/13/2014  FINDINGS:  Small remote lacunar infarct to the right globus pelvis nucleus.  Small remote lacunar infarct of the head ofthe right caudate  nucleus. Otherwise, the brainstem, cerebellum, cerebral peduncles,  thalamus, basal ganglia, basilar cisterns, and ventricular system  appear within normal limits. Periventricular white matter and corona  radiata hypodensities favor chronic ischemic microvascular white  matter disease.    No intracranial hemorrhage, mass lesion, or acute CVA. There is  atherosclerotic calcification of the cavernous carotid arteries  bilaterally.     IMPRESSION:  1. No acute intracranial findings.  2. Periventricular white matter and corona radiata hypodensities  favor chronic ischemic microvascular white matter disease.  3. Small remote lacunar infarcts in the right globus pallidus and  right caudate head nuclei.      Electronically Signed    By: Sherryl Barters M.D.    On: 10/08/2014 19:37         Verified By: Carron Curie, M.D.,   Radiology Impression: Radiology Impression: CT of head personally reviewed by me and shows moderate to severe white matter chronic changes   Impression/Recommendations: Recommendations:   prior notes reviewed by me reviewed by me   Probable syncope-  hx is more consistent with this but headache is concerning for post-epileptic event Headache-   sounds like chronic opioid usage but could be post-ictal as well Severe white matter disease-  likely cause of mild decreased cognition d/c tramadol and xanax EEG tomorrow PRN fioricet or toradol for headache agree with cardiology w/u  will follow briefly  Electronic Signatures: Jamison Neighbor (MD)  (Signed 07-Jan-16 19:20)  Authored: REFERRING PHYSICIAN, Primary Care Physician, Consult, History of Present Illness, Review of Systems, PAST MEDICAL/SURGICAL HISTORY, HOME MEDICATIONS, ALLERGIES, Social/Family History, NURSING VITAL SIGNS, Physical Exam-, LAB RESULTS, RADIOLOGY RESULTS, Recommendations   Last Updated: 07-Jan-16 19:20 by Jamison Neighbor (MD)

## 2015-02-01 NOTE — H&P (Signed)
PATIENT NAME:  Jill Durham, Jill Durham MR#:  [id number] DATE OF BIRTH:  Aug 29, 1946  DATE OF ADMISSION:  12/18/2014  ADMITTING PHYSICIAN: Enid Baas, MD.    PRIMARY CARE PHYSICIAN:  Corky Downs, MD.    CHIEF COMPLAINT: Chest pain.   HISTORY OF PRESENT ILLNESS: Jill Durham is [name] with a past medical history significant for diabetic mellitus, diabetic neuropathy, history of recurrent syncope, chronic headaches, coronary artery disease with no intervention in the past, hypertension, paroxysmal atrial fibrillation, seizure disorder, and aortic surgery for possible thrombus versus aneurysm in the past, came to the hospital secondary to chest pain that started this morning. The patient was actually here in the hospital in January 2016 for workup for recurrent syncope, seen by cardiology and neurology, and was discharged without any diagnosis of seizures or being on any antiepileptic medications. She said her syncope has not been an issue. Today she woke up, she felt sick, she was weak, she did not want to get up, she was laying around in the couch and suddenly by afternoon she had an episode where she was clammy with palpitations, her chest pressure started, she could not breathe, and came to the Emergency Room. 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 The day after the surgery she developed a heart attack, which was managed conservatively.  I do not know if it was a plaque rupture or a thrombus that caused a mild heart attack or demand ischemia. She had a stress test done by Dr. Juel Burrow in the last year which was negative according to the patient.  Her chest pain still persists at this time. She is placed on a nitroglycerin patch. He is allergic to morphine and she is requesting for Dilaudid for her pain.   PAST MEDICAL HISTORY:    1. Diabetes mellitus.  2. Diabetic neuropathy.  3. History of recurrent syncope.  4. Paroxysmal seizure disorder.  5. Paroxysmal atrial fibrillation.  6. Hypertension.  7. Coronary artery disease.  8. Hyperlipidemia.  9. History of TIA.    PAST SURGICAL HISTORY: Bladder tuck surgery and thoracic surgery for aortic growth.   ALLERGIES TO MEDICATIONS: ERYTHROMYCIN AND MORPHINE.   CURRENT HOME MEDICATIONS:  1. Percocet 5/325 mg 1 tablet q. 6 hours p.r.n. for pain.  2. Aspirin 325 mg p.o. daily.  3. Cyanocobalamin 1000 mcg injectable once a week.  4. Enalapril 10 mg p.o. b.i.d.  5. Fluoxetine 40 mg p.o. daily.  6. Lantus 16 units subcutaneous in the morning.  7. Lorazepam 1 mg p.o. b.i.d.  8. Multivitamin 1 tablet p.o. daily.  9. Nortriptyline 75 mg p.o. daily.  10. Omeprazole 40 mg p.o. b.i.d.  11. Vitamin B12 1000 mcg p.o. daily.  12. Vitamin C 1000 mg p.o. daily.   SOCIAL HISTORY: Lives at home with her husband.  Currently is retired. Smokes about half pack of cigarettes per day.  No alcohol use.   FAMILY HISTORY: Mom had diabetic complications. Grandparents, one with cerebral hemorrhage, the other with heart disease.   REVIEW OF SYSTEMS:    CONSTITUTIONAL: No fever. Positive for fatigue and weakness.  EYES: No blurry vision, double vision, inflammation, or glaucoma.  EARS, NOSE, AND THROAT: No tinnitus, ear pain, hearing loss, epistaxis, or discharge.  RESPIRATORY: Positive for chronic cough and chronic obstructive pulmonary disease. No hemoptysis, wheeze, painful respiration.  CARDIOVASCULAR: Positive for chest pain.  No orthopnea, edema, arrhythmia, palpitations, or syncope.  GASTROINTESTINAL: No nausea, vomiting, diarrhea, abdominal pain, hematemesis, or melena. Complains of bloating.  GENITOURINARY: No dysuria, hematuria, renal calculus, frequency, or incontinence.  ENDOCRINE: No polyuria, nocturia, thyroid problems, heat or cold intolerance.  HEMATOLOGY: No anemia,  easy bruising or bleeding.  SKIN: No acne, rash, or lesions.  MUSCULOSKELETAL: No neck, back, shoulder pain, arthritis or gout.  NEUROLOGIC: Positive for history of TIA, seizure.  No ataxia or CVA.  PSYCHOLOGICAL: No anxiety, insomnia or depression.   PHYSICAL EXAMINATION:  VITAL SIGNS: Temperature 97.9 degrees Fahrenheit, pulse 73, respirations 15, blood pressure 156/76, pulse oximetry 99% on room air.  GENERAL: Well-built, well-nourished female lying in bed, not in any acute distress.  HEENT: Normocephalic, atraumatic. Pupils equal, round, reacting to light. Anicteric sclerae. Extraocular movements intact. Oropharynx is clear without erythema, mass, or exudates.  NECK: Supple. No thyromegaly, JVD, or carotid bruits.  No lymphadenopathy.   LUNGS:  Moving air bilaterally. No wheeze or crackles. No use of accessory muscles for breathing.  CARDIOVASCULAR: S1, S2, regular rate and rhythm.  Loud 3/6 systolic murmur heard in the aortic area. No rubs or gallops.  ABDOMEN: Soft, nontender, nondistended. No hepatosplenomegaly. Normal bowel sounds.  EXTREMITIES: No pedal edema. No clubbing or cyanosis.  2 + dorsalis pedis pulses palpable bilaterally.  SKIN: No acne, rash, or lesions.  LYMPHATICS: No cervical lymphadenopathy.  NEUROLOGIC: Cranial nerves intact. No focal motor or sensory deficits.   PSYCHOLOGICAL: The patient is awake, alert, oriented x 3.   LABORATORY DATA:  1.  Urinalysis negative for any infection.  2.  BNP is slightly elevated at 156.  3.  Sodium 139, potassium 3.9, chloride 104, bicarbonate is 27, BUN 13, creatinine 1.3, glucose 126, and calcium of 8.9.  4.  ALT 13, AST 21, alkaline phosphatase 112, total bilirubin 0.3, and albumin of 3.7.  5.  WBC 10.5, hemoglobin 10.9, hematocrit 34.6, platelet count 278,000.    6.  Troponin less than 0.03.    7.  INR is 0.9.   8.  Chest x-ray showing clear lung fields, no acute cardiopulmonary disease.  9.  EKG showing normal sinus rhythm,  heart rate of 73, no acute ST-T wave abnormalities.   ASSESSMENT AND PLAN: This is a 69 year old female with past medical history significant for coronary artery disease after aortic surgery without any intervention at the time, hypertension, diabetes, neuropathy, depression, seizure, disorder paroxysmal atrial fibrillation, admitted for chest pain.   1.  Chest pain, possible unstable angina.  Admitted to telemetry. Re-cycle the troponins. Cardiology has been consulted. Myoview in a.m. Continue her aspirin. Check lipid profile. Add beta blocker if heart rate is stable and continue nitroglycerin p.r.n.  2.  Chronic headaches, recently evaluated for recurrent syncope and headaches by neurology. EEG was negative at the time, likely has chronic migraines. Requesting for pain medication, takes Percocet  at home, we will continue that.  3.  Seizures versus recurrent syncope. No active issue at this time. Recently evaluated by both cardiology and neurology in January 2016. Not on antiepileptics. Outpatient followup recommended.  4.  Hypertension. Enalapril and metoprolol for now.  5.  Diabetes.  On Lantus and sliding scale insulin.  6.  Deep vein thrombosis prophylaxis with subcutaneous heparin.   CODE STATUS: Full code.   TIME SPENT ON ADMISSION: 50 minutes.     ____________________________ Enid Baas, MD rk:bu D: 12/18/2014 20:26:02 ET T: 12/18/2014 21:00:06 ET JOB#: [id number]  cc: Enid Baasadhika Grettell Ransdell, MD, <Dictator> Corky DownsJaved Masoud, MD Enid BaasADHIKA Yael Angerer MD ELECTRONICALLY SIGNED 12/27/2014 17:50

## 2015-02-01 NOTE — H&P (Signed)
PATIENT NAME:  Jill Durham, [name] MR#:  [id number] DATE OF BIRTH:  23-May-1946  DATE OF ADMISSION:  10/08/2014  PRIMARY CARE PHYSICIAN: Corky DownsJaved Masoud. M.D.   REQUESTING PHYSICIAN: Sheran FavaMark R. Fanny BienQuale, MD  CHIEF COMPLAINT: Syncope.   HISTORY OF PRESENT ILLNESS: The patient is a 69 year old female with a known history of atrial fibrillation, paroxysmal seizure disorder and history of recurrent syncope is being admitted for chest pressure and recurrent syncope. The patient has been passing out the last 2 weeks, reports hitting head and also feels really tired since Christmas. This morning, she was noted to be passed out, was noted on the floor. Her husband had been kept calling the home for about 3 times, nobody responded, finally he came about an hour later and found her on the floor. She was found to be somewhat dizzy with slurred speech. She also reported some numbness and tingling her tongue and lips. He brought her down to the Emergency Department. While in the ED, she was reporting 10/10 chest pain and is being admitted for further evaluation and management. She is not reporting any dizziness at this time.   PAST MEDICAL HISTORY:  1. Diabetic neuropathy.  2. History of recurrent syncope.  3. Coronary artery disease.  4. Hypertension.  5. Hyperlipidemia.  6. Atrial fibrillation,  7. Paroxysmal seizure disorder.   SOCIAL HISTORY: Smokes 1/2 pack of cigarettes daily for last 30 years. No alcohol. She used to be a Geologist, engineeringteacher assistant. After that she worked at [employer].   FAMILY HISTORY: Mother and grandmother with heart disease.   ALLERGIES: ERYTHROMYCIN AND MORPHINE.   MEDICATIONS AT HOME: 1. Alprazolam 0.25 mg p.o. b.i.d. as needed.  2. Amlodipine 5 mg p.o. at bedtime.  3. Aspirin 81 mg p.o. daily.  4. Enalapril 5 mg p.o. daily.  5. Fluoxetine 40 mg p.o. at bedtime.  6. Insulin Lantus 20 units subcutaneous at bedtime.  7. Loratadine 10 mg p.o. at bedtime.  8. Nortriptyline 75 mg  p.o. at bedtime.  9. Omeprazole 40 mg p.o. b.i.d.  10. Tramadol 50 mg p.o. b.i.d. as needed.   REVIEW OF SYSTEMS:  CONSTITUTIONAL: No fever. Positive for weakness.  EYES: No blurred or double vision.  EARS, NOSE, THROAT: No tinnitus or ear pain.  RESPIRATORY: No cough, wheezing, hemoptysis.  CARDIOVASCULAR: Positive for chest pain. No orthopnea or edema.  GASTROINTESTINAL: No nausea, vomiting, diarrhea.  GENITOURINARY: No dysuria or hematuria.  ENDOCRINE: No polyuria or nocturia.  HEMATOLOGY: No anemia or easy bruising.  SKIN: No rash or lesion.  MUSCULOSKELETAL: Some joint pains and muscle ache from recurrent falls.  NEUROLOGIC: Recurrent syncope/presyncope, fall. She does have some tingling and numbness in the extremities, left more than the right.  PSYCHIATRY: History of anxiety and depression.   PHYSICAL EXAMINATION: VITAL SIGNS: Temperature 97.9, heart rate 79 per minute, respirations 18 per minute, pressure 180/77. She is saturating 98% on room air.  GENERAL: The patient is a 69 year old female lying in the bed comfortably without any acute distress.  EYES: Pupils equal, round, and reactive to light and accommodation. No scleral icterus. Extraocular muscles intact.  HEENT: Head atraumatic, normocephalic. Oropharynx and nasopharynx clear.  NECK: Supple. No jugulovenous distention. No thyroid enlargement or tenderness.   LUNGS: Clear to auscultation bilaterally. No wheezing, rales, rhonchi, or crepitation. CARDIOVASCULAR: S1, S2 normal. No murmurs, rubs or gallops.  ABDOMEN: Soft, nontender, nondistended. Bowel sounds present. No organomegaly or mass.  EXTREMITIES: No pedal cyanosis or clubbing.  NEUROLOGIC: Cranial nerves III through  XII intact. Muscle strength 5/5 in all extremities. Sensation intact.  PSYCHIATRIC: The patient is alert and oriented x3.  SKIN: No obvious rash, lesion or ulcer.  MUSCULOSKELETAL: No joint effusion or tenderness.  PSYCHIATRIC: The patient is alert  and oriented x3. Judgment and insight seems intact.   LABORATORY DATA: Normal CBC except hemoglobin of 11.6. Negative first set of troponins. Normal BMP except creatinine of 1.47, blood sugar 184. Urinalysis looks negative except 3+ bacteria. Normal coagulation panel.   Chest x-ray in the Emergency Department showed no acute cardiopulmonary disease.   CT scan of the head without contrast showed no acute intracranial finding. Chronic ischemic microvascular white disease. Small remote lacunar infarct in the right side .  EKG shows normal sinus rhythm, left ventricular hypertrophy. No ST-T changes.   IMPRESSION AND PLAN: 1. Recurrent syncope/presyncope. We will obtain serial troponins. Consult cardiology and neurology for further evaluation. This is likely cardiac in nature, probably vasovagal versus orthostatic. We will check TSH and obtain orthostatic vitals. We will hold off MRI of the brain as she had it back in 2014, which was negative. We will hold off carotid Dopplers depending on neurology input we can get that.  We will obtain 2-D echocardiogram at this time.  2. Acute renal failure, likely prerenal. We will monitor with IV hydration.  3. Diabetes mellitus. We will continue insulin check, start her on sliding-scale insulin.  4. Anxiety/depression. We will continue home medication.  5.   Tobacco abuse: counselled for 3 mins, ready to quit, already trying, denies need for nicotine replacement therapy while in hospital.  CODE STATUS: Full code.   TOTAL TIME TAKING CARE OF THIS PATIENT: 35 minutes.    ____________________________ Ellamae Sia. Sherryll Burger, MD vss:ST D: 10/08/2014 21:33:45 ET T: 10/08/2014 21:56:09 ET JOB#: [id number]  cc: Jaquala Fuller S. Sherryll Burger, MD, <Dictator> Corky Downs, MD Darlin Priestly. Lady Gary, MD Patricia Pesa MD ELECTRONICALLY SIGNED 10/16/2014 22:10

## 2015-02-01 NOTE — Consult Note (Signed)
PATIENT NAME:  Jill Durham, Jill Durham MR#:  [id number] DATE OF BIRTH:  Apr 03, 1946  DATE OF CONSULTATION:  12/19/2014  REFERRING PHYSICIAN:  Enid Baasadhika Kalisetti, MD CONSULTING PHYSICIAN:  Dwayne D. Juliann Paresallwood, MD  CARDIOLOGY CONSULTATION   PRIMARY PHYSICIAN: Corky DownsJaved Masoud, MD  INDICATION: Chest pain, syncope, atrial fibrillation.   HISTORY OF PRESENT ILLNESS: The patient is a 69 year old white female with history of diabetes, diabetic neuropathy, syncope, chronic headache, CAD, no previous intervention. Complained of hypertension, paroxysmal atrial fibrillation, seizure disorder. She has had aortic surgery for an aneurysm in the past. Presented with chest pain symptoms. She had been admitted back in January with syncope. The patient woke up, felt sick, felt weak, laid in bed and on couch, and suddenly started having palpitations, chest pain, had trouble breathing. Came to the Emergency Room and was evaluated and admitted for chest pain. She had nitroglycerin and Dilaudid for the pain. Ruled out for myocardial infarction and then referred for functional study   PAST MEDICAL HISTORY: Diabetes, diabetic neuropathy, syncope, seizure disorder, atrial fibrillation, hypertension, coronary artery disease, hyperlipidemia, TIA.    PAST SURGICAL HISTORY: Bladder tack, thoracic aneurysm surgery repair.   ALLERGIES: ERYTHROMYCIN, MORPHINE.   MEDICATIONS: Percocet every 6 hours p.r.n., aspirin 325 a day, vitamin B12 once a week, enalapril 10 twice a day, fluoxetine 40 a day, Lantus 16 units in the morning, lorazepam 1 mg twice a day, multivitamin once a day, nortriptyline 25 daily, omeprazole 40 twice a day, vitamin C.   SOCIAL HISTORY: Lives with her husband, retired. Smokes half-pack per day. No alcohol consumption.   FAMILY HISTORY: Diabetes, cerebral hemorrhage.    REVIEW OF SYSTEMS: No blackout spells. No nausea, vomiting. No fever, chills, sweats. No weight loss. No weight gain. No hemoptysis or hematemesis.  No bright red blood per rectum. No vision change or hearing change. Denies sputum production or cough. Complaining of chest pain, trouble breathing, shortness of breath   PHYSICAL EXAMINATION:  VITAL SIGNS: Blood pressure 150/70, pulse is 70, respiratory rate of 16.  HEENT: Normocephalic, atraumatic. Pupils equal and reactive to light.  NECK: Supple.  LUNGS: Clear to auscultation with no wheeze, rhonchi, or rale. ABDOMEN: Benign.  EXTREMITIES: Within normal limits. NEUROLOGIC: Intact. SKIN: Normal.   DIAGNOSTIC DATA: Urinalysis was negative. BNP 156. Sodium 139, potassium 3.9, chloride of 104, bicarbonate of 27, BUN of 13, creatinine 1.3, glucose 126, calcium is 8.9. ALT 13, total bilirubin 0.3.  White count 10.5, hemoglobin 10.9, hematocrit 34.6, platelet count of 278,000. Troponin 0.02. INR 0.9. Chest x-ray negative.   EKG: Normal sinus rhythm , no ST or T wave changes, rate of 75.   ASSESSMENT: Chest pain, headache, seizures, hypertension, diabetes.   PLAN:  1.  I agree with functional study. Consider echocardiogram. Continue nitrates. I agree with aspirin, beta blockade therapy.  2.  Chronic headaches. Continue pain control, Percocet at home. Dilaudid here in the hospital.  3.  Seizures. Continue current therapy. Follow up with neurology.  4.  Hypertension. Continue enalapril and metoprolol with adequate blood pressure control.  5.  Diabetes. Continue Lantus and regular insulin sliding scale.  6.  If functional study is negative, we will treat the patient conservatively, and have the patient follow up with her primary cardiologist.   ____________________________ Bobbie Stackwayne D. Juliann Paresallwood, MD ddc:bm D: 12/20/2014 18:18:00 ET T: 12/20/2014 22:12:43 ET JOB#: [id number]  cc: Dwayne D. Juliann Paresallwood, MD, <Dictator> Alwyn PeaWAYNE D CALLWOOD MD ELECTRONICALLY SIGNED 12/22/2014 13:32

## 2015-02-01 NOTE — Discharge Summary (Signed)
PATIENT NAME:  Jill Durham, Jill Durham MR#:  [id number] DATE OF BIRTH:  [birth year]  DATE OF ADMISSION:  12/18/2014 DATE OF DISCHARGE:  12/19/2014  DISCHARGE DIAGNOSIS:  Chest pain, atypical.   PROCEDURES: Nuclear stress test normal with no blockages.   CONSULTATIONS: Cardiology, Dr. Juliann Paresallwood.   DISCHARGE MEDICATIONS: Omeprazole 40 mg 1 capsule p.o. 2 times a day, nortriptyline 75 mg 1 capsule p.o. once daily at bedtime, fluoxetine 40 mg 1 capsule p.o. once daily, multivitamin 1 tablet p.o. once daily, vitamin B12 at 1000 mcg p.o. once daily, vitamin C 1000 mg p.o. once daily in a.m., Lantus subcutaneously once daily in a.m., cyanocobalamin 1 mL injectable once a week, acetaminophen with oxycodone 325/5 one tablet p.o. every 6 hours as needed for pain, aspirin 325 mg 1 tablet p.o. once daily, lorazepam 1 mg p.o. 2 times a day, metoprolol tartrate 25 mg 1 tablet p.o. 2 times a day, atorvastatin 80 mg 1 tablet p.o. at bedtime.   CODE STATUS:  Full code.   DIET: Low sodium, low fat carb control, regular consistency.   FOLLOW-UP:   With primary care physician in 1 week, [facility] in 1-2 weeks and outpatient cardiology rehab in 1-2 weeks.   BRIEF HISTORY AND PHYSICAL AND HOSPITAL COURSE: The patient is [name] who came into the ED with a chief complaint of chest pain. The patient has a history of diabetes mellitus, diabetic neuropathy, coronary artery disease with no interventions in the past as well as paroxysmal atrial fibrillation and seizure disorder. Please review history and physical for details. The patient had a stress test done by Dr. Juel BurrowMasoud last year which was negative as reported by the patient.  1. Chest pain, possible unstable angina, admitted to telemetry, cycle cardiac biomarkers. Cardiac enzymes x 3 were negative. The patient was monitored on telemetry, [facility]. Dr. Juliann Paresallwood has ordered nuclear stress test. She had a nuclear stress test today  which was apparently normal with no blockages. Lipid panel has revealed LDL at 170 and the patient is started on high-dose statin. Also, the patient'Durham blood pressure being high, metoprolol is added to the regimen. Today, her chest pain is  resolved.  Hospital course is uneventful. Stress test is being normal, Dr. Juliann Paresallwood has recommended to discharge the patient with aspirin, statin and beta blockers.  The patient [facility] and outpatient cardiology rehabilitation in 1-2 weeks.  2. Chronic headaches, recently evaluated for recurrent syncope.  EEG was negative. This is probably from a chronic migraine.   The patient  is to continue her home medication, Percocet, as needed basis.  3. Seizures versus recurrent syncope, recently evaluated by cardiology and neurology in January 2016, not on any antiepileptics. Outpatient follow-up with neurology and cardiology is recommended.  4. Insulin-requiring diabetes mellitus. Continue her home medication, Lantus, at home dose.  5. Hypertension. Continue blood pressure medication , metoprolol 25 mg p.o. b.i.d.   VITAL SIGNS:  Temperature 97.4, pulse 57, respirations 20, blood pressure is 113/52, pulse oximetry 94% on room air.   PHYSICAL EXAMINATION:    GENERAL APPEARANCE: The patient is not in any acute distress. Moderately built and nourished.  HEENT: Normocephalic, atraumatic. Pupils are equally reacting to light and accommodation. No scleral icterus. No conjunctival injection. No sinus tenderness. No postnasal drip.  Moist mucous membranes.  NECK: Supple. No JVD. No thyromegaly. Range of motion is intact.  LUNGS: Clear to auscultation bilaterally. No accessory  muscle use and no anterior chest wall tenderness on palpation.  CARDIAC: S1, S2 normal. Regular rate and rhythm. No murmurs.  GASTROINTESTINAL: Soft. Bowel sounds are positive in all 4 quadrants. Nontender, nondistended. No masses felt. NEUROLOGIC: Awake, alert,  oriented x 3. Cranial nerves II-XII are grossly intact. Motor and sensory are intact. Reflexes are 2+.  EXTREMITIES: No edema. No cyanosis. No clubbing.  SKIN: Warm to touch.  Normal turgor. No rashes. No lesions.  MUSCULOSKELETAL: No joint effusion, tenderness or edema. PSYCHIATRIC:   Normal mood and affect.   LABORATORY AND IMAGING STUDIES: Troponin less than 0.03 x 3. Hemoglobin 10.8, hematocrit 34.5, platelets are 293,000. BMP: Sodium 140, potassium normal, BUN 13, creatinine 1.22, calcium is 8.5,  LDL 172, total cholesterol 250, triglycerides 155, urea nitrite negative leukocyte esterase.  Chest x-ray normal, no acute findings.   Nuclear stress test performed on March 18: No significant wall motion abnormality.  Estimated ejection fraction at 56%. Left ventricular global function was normal. No artifact noted on the study.  No EKG changes concerning for ischemia.   Normal study.     The plan of care was discussed with the patient. She is agreeable to the plan. The patient is getting discharged in stable condition.    ____________________________ Ramonita Lab, MD ag:tr D: 12/19/2014 16:39:00 ET T: 12/19/2014 16:54:59 ET JOB#: [id number]  cc: [facility] Cardiology [facility] Neurology Ramonita Lab, MD, <Dictator>   Ramonita Lab MD ELECTRONICALLY SIGNED 01/02/2015 12:56

## 2015-02-16 DIAGNOSIS — R1084 Generalized abdominal pain: Secondary | ICD-10-CM | POA: Diagnosis not present

## 2015-02-16 DIAGNOSIS — R55 Syncope and collapse: Secondary | ICD-10-CM | POA: Diagnosis not present

## 2015-02-16 DIAGNOSIS — R0789 Other chest pain: Secondary | ICD-10-CM | POA: Diagnosis not present

## 2015-02-16 DIAGNOSIS — I517 Cardiomegaly: Secondary | ICD-10-CM | POA: Diagnosis not present

## 2015-02-16 DIAGNOSIS — R140 Abdominal distension (gaseous): Secondary | ICD-10-CM | POA: Diagnosis not present

## 2015-02-16 DIAGNOSIS — Z5321 Procedure and treatment not carried out due to patient leaving prior to being seen by health care provider: Secondary | ICD-10-CM | POA: Diagnosis not present

## 2015-02-18 DIAGNOSIS — E119 Type 2 diabetes mellitus without complications: Secondary | ICD-10-CM | POA: Diagnosis not present

## 2015-02-18 DIAGNOSIS — I10 Essential (primary) hypertension: Secondary | ICD-10-CM | POA: Diagnosis not present

## 2015-02-18 DIAGNOSIS — E1165 Type 2 diabetes mellitus with hyperglycemia: Secondary | ICD-10-CM | POA: Diagnosis not present

## 2015-02-18 DIAGNOSIS — F458 Other somatoform disorders: Secondary | ICD-10-CM | POA: Diagnosis not present

## 2015-02-18 DIAGNOSIS — E784 Other hyperlipidemia: Secondary | ICD-10-CM | POA: Diagnosis not present

## 2015-02-18 DIAGNOSIS — R5381 Other malaise: Secondary | ICD-10-CM | POA: Diagnosis not present

## 2015-03-20 DIAGNOSIS — R011 Cardiac murmur, unspecified: Secondary | ICD-10-CM | POA: Diagnosis not present

## 2015-03-20 DIAGNOSIS — N393 Stress incontinence (female) (male): Secondary | ICD-10-CM | POA: Diagnosis not present

## 2015-03-20 DIAGNOSIS — I2510 Atherosclerotic heart disease of native coronary artery without angina pectoris: Secondary | ICD-10-CM | POA: Diagnosis not present

## 2015-03-20 DIAGNOSIS — E119 Type 2 diabetes mellitus without complications: Secondary | ICD-10-CM | POA: Diagnosis not present

## 2015-04-20 ENCOUNTER — Ambulatory Visit
Admission: RE | Admit: 2015-04-20 | Discharge: 2015-04-20 | Disposition: A | Discharge status: Home / Self Care | Payer: Commercial Managed Care - HMO | Source: Ambulatory Visit | Attending: Cardiology | Admitting: Cardiology

## 2015-04-20 ENCOUNTER — Other Ambulatory Visit: Payer: Self-pay | Admitting: Internal Medicine

## 2015-04-20 ENCOUNTER — Ambulatory Visit
Admission: RE | Admit: 2015-04-20 | Discharge: 2015-04-20 | Disposition: A | Discharge status: Home / Self Care | Payer: Commercial Managed Care - HMO | Source: Ambulatory Visit | Attending: Internal Medicine | Admitting: Internal Medicine

## 2015-04-20 DIAGNOSIS — M25551 Pain in right hip: Secondary | ICD-10-CM

## 2015-04-20 DIAGNOSIS — R1031 Right lower quadrant pain: Secondary | ICD-10-CM

## 2015-04-20 DIAGNOSIS — M7071 Other bursitis of hip, right hip: Secondary | ICD-10-CM | POA: Diagnosis not present

## 2015-04-20 DIAGNOSIS — Q6589 Other specified congenital deformities of hip: Secondary | ICD-10-CM | POA: Diagnosis not present

## 2015-04-20 DIAGNOSIS — E119 Type 2 diabetes mellitus without complications: Secondary | ICD-10-CM | POA: Diagnosis not present

## 2015-04-20 DIAGNOSIS — M25751 Osteophyte, right hip: Secondary | ICD-10-CM | POA: Diagnosis not present

## 2015-04-20 DIAGNOSIS — Z90710 Acquired absence of both cervix and uterus: Secondary | ICD-10-CM | POA: Diagnosis not present

## 2015-04-22 DIAGNOSIS — M7071 Other bursitis of hip, right hip: Secondary | ICD-10-CM | POA: Diagnosis not present

## 2015-04-22 DIAGNOSIS — E119 Type 2 diabetes mellitus without complications: Secondary | ICD-10-CM | POA: Diagnosis not present

## 2015-04-22 DIAGNOSIS — R6889 Other general symptoms and signs: Secondary | ICD-10-CM | POA: Diagnosis not present

## 2015-04-22 DIAGNOSIS — I2510 Atherosclerotic heart disease of native coronary artery without angina pectoris: Secondary | ICD-10-CM | POA: Diagnosis not present

## 2015-04-22 NOTE — Patient Outreach (Signed)
[facility] Care Management  04/22/2015  Jill Durham [birth year] [id number]   [facility], assigned to Wynonia Lawmanheryl Poteat for patient outreach.  [name] [name], AAS [facility] Care Management Assistant [phone]

## 2015-05-04 ENCOUNTER — Other Ambulatory Visit: Payer: Self-pay

## 2015-05-04 NOTE — Patient Outreach (Signed)
[facility]) Care Management  05/04/2015  Jill Durham 1946/07/24 [id number]  First telephone call to patient regarding [employer] referral.  No answer voice message left.  HIPAA compliant message left.  Plan: RN will contact patient within the next 1-2 weeks.    Bary Leriche, RN, MSN [facility] Care Management RN Telephonic Health Coach [phone]

## 2015-05-07 ENCOUNTER — Other Ambulatory Visit: Payer: Self-pay

## 2015-05-07 NOTE — Patient Outreach (Signed)
[facility]) Care Management  05/07/2015  Jill Durham [birth year] [id number]  Second telephone call to patient regarding [facility] referral.  No answer voice message left.  HIPAA compliant message left.    Plan: RN will contact patient within one week.  Bary Leriche, RN, MSN [facility] Care Management RN Telephonic Health Coach [phone]

## 2015-05-08 ENCOUNTER — Other Ambulatory Visit: Payer: Self-pay

## 2015-05-08 NOTE — Patient Outreach (Addendum)
[facility]) Care Management  05/08/2015  Jill Durham 11/26/1945 [id number]   Third telephone call to patient regarding [facility] referral.  Person answering the states patient not at home.  HIPAA compliant message left.    Plan:  RN Health coach will forward patient to Nena Polio to close case due to being unable to reach. RN Health Coach will notify patient primary MD of unable to reach status.  RN Health Coach will send patient [facility] Care Management  Information.  Bary Leriche, RN, MSN [facility] Care Management RN Telephonic Health Coach [phone]

## 2015-05-14 ENCOUNTER — Emergency Department: Payer: Commercial Managed Care - HMO

## 2015-05-14 ENCOUNTER — Observation Stay
Admission: EM | Admit: 2015-05-14 | Discharge: 2015-05-16 | Disposition: A | Discharge status: Home / Self Care | Payer: Commercial Managed Care - HMO | Attending: Internal Medicine | Admitting: Internal Medicine

## 2015-05-14 ENCOUNTER — Encounter: Payer: Self-pay | Admitting: Emergency Medicine

## 2015-05-14 DIAGNOSIS — I10 Essential (primary) hypertension: Secondary | ICD-10-CM | POA: Insufficient documentation

## 2015-05-14 DIAGNOSIS — Z881 Allergy status to other antibiotic agents status: Secondary | ICD-10-CM | POA: Diagnosis not present

## 2015-05-14 DIAGNOSIS — Z79899 Other long term (current) drug therapy: Secondary | ICD-10-CM | POA: Insufficient documentation

## 2015-05-14 DIAGNOSIS — R531 Weakness: Secondary | ICD-10-CM

## 2015-05-14 DIAGNOSIS — N390 Urinary tract infection, site not specified: Secondary | ICD-10-CM | POA: Diagnosis not present

## 2015-05-14 DIAGNOSIS — R05 Cough: Secondary | ICD-10-CM | POA: Insufficient documentation

## 2015-05-14 DIAGNOSIS — W06XXXA Fall from bed, initial encounter: Secondary | ICD-10-CM | POA: Diagnosis not present

## 2015-05-14 DIAGNOSIS — R569 Unspecified convulsions: Secondary | ICD-10-CM | POA: Insufficient documentation

## 2015-05-14 DIAGNOSIS — T424X1A Poisoning by benzodiazepines, accidental (unintentional), initial encounter: Secondary | ICD-10-CM | POA: Diagnosis not present

## 2015-05-14 DIAGNOSIS — R0600 Dyspnea, unspecified: Secondary | ICD-10-CM

## 2015-05-14 DIAGNOSIS — Z951 Presence of aortocoronary bypass graft: Secondary | ICD-10-CM | POA: Diagnosis not present

## 2015-05-14 DIAGNOSIS — R109 Unspecified abdominal pain: Secondary | ICD-10-CM | POA: Diagnosis not present

## 2015-05-14 DIAGNOSIS — R0602 Shortness of breath: Secondary | ICD-10-CM | POA: Insufficient documentation

## 2015-05-14 DIAGNOSIS — G40909 Epilepsy, unspecified, not intractable, without status epilepticus: Secondary | ICD-10-CM | POA: Insufficient documentation

## 2015-05-14 DIAGNOSIS — R55 Syncope and collapse: Secondary | ICD-10-CM | POA: Insufficient documentation

## 2015-05-14 DIAGNOSIS — E785 Hyperlipidemia, unspecified: Secondary | ICD-10-CM | POA: Diagnosis not present

## 2015-05-14 DIAGNOSIS — Z9851 Tubal ligation status: Secondary | ICD-10-CM | POA: Diagnosis not present

## 2015-05-14 DIAGNOSIS — B9620 Unspecified Escherichia coli [E. coli] as the cause of diseases classified elsewhere: Secondary | ICD-10-CM | POA: Insufficient documentation

## 2015-05-14 DIAGNOSIS — S0990XA Unspecified injury of head, initial encounter: Secondary | ICD-10-CM | POA: Insufficient documentation

## 2015-05-14 DIAGNOSIS — R4182 Altered mental status, unspecified: Secondary | ICD-10-CM | POA: Diagnosis not present

## 2015-05-14 DIAGNOSIS — N289 Disorder of kidney and ureter, unspecified: Secondary | ICD-10-CM | POA: Diagnosis not present

## 2015-05-14 DIAGNOSIS — R300 Dysuria: Secondary | ICD-10-CM | POA: Diagnosis not present

## 2015-05-14 DIAGNOSIS — R42 Dizziness and giddiness: Secondary | ICD-10-CM | POA: Insufficient documentation

## 2015-05-14 DIAGNOSIS — F17210 Nicotine dependence, cigarettes, uncomplicated: Secondary | ICD-10-CM | POA: Insufficient documentation

## 2015-05-14 DIAGNOSIS — Z91018 Allergy to other foods: Secondary | ICD-10-CM | POA: Insufficient documentation

## 2015-05-14 DIAGNOSIS — Z833 Family history of diabetes mellitus: Secondary | ICD-10-CM | POA: Diagnosis not present

## 2015-05-14 DIAGNOSIS — K219 Gastro-esophageal reflux disease without esophagitis: Secondary | ICD-10-CM | POA: Diagnosis not present

## 2015-05-14 DIAGNOSIS — E119 Type 2 diabetes mellitus without complications: Secondary | ICD-10-CM

## 2015-05-14 DIAGNOSIS — F329 Major depressive disorder, single episode, unspecified: Secondary | ICD-10-CM | POA: Insufficient documentation

## 2015-05-14 DIAGNOSIS — M542 Cervicalgia: Secondary | ICD-10-CM | POA: Diagnosis not present

## 2015-05-14 DIAGNOSIS — W19XXXA Unspecified fall, initial encounter: Secondary | ICD-10-CM | POA: Diagnosis not present

## 2015-05-14 DIAGNOSIS — I252 Old myocardial infarction: Secondary | ICD-10-CM | POA: Insufficient documentation

## 2015-05-14 DIAGNOSIS — T148 Other injury of unspecified body region: Secondary | ICD-10-CM | POA: Diagnosis present

## 2015-05-14 DIAGNOSIS — R51 Headache: Secondary | ICD-10-CM | POA: Diagnosis not present

## 2015-05-14 DIAGNOSIS — M25551 Pain in right hip: Secondary | ICD-10-CM | POA: Insufficient documentation

## 2015-05-14 DIAGNOSIS — T50904A Poisoning by unspecified drugs, medicaments and biological substances, undetermined, initial encounter: Secondary | ICD-10-CM | POA: Diagnosis not present

## 2015-05-14 DIAGNOSIS — Z8673 Personal history of transient ischemic attack (TIA), and cerebral infarction without residual deficits: Secondary | ICD-10-CM | POA: Diagnosis not present

## 2015-05-14 DIAGNOSIS — R296 Repeated falls: Secondary | ICD-10-CM | POA: Diagnosis not present

## 2015-05-14 DIAGNOSIS — M25552 Pain in left hip: Secondary | ICD-10-CM | POA: Insufficient documentation

## 2015-05-14 DIAGNOSIS — N3000 Acute cystitis without hematuria: Secondary | ICD-10-CM

## 2015-05-14 DIAGNOSIS — F419 Anxiety disorder, unspecified: Secondary | ICD-10-CM | POA: Insufficient documentation

## 2015-05-14 DIAGNOSIS — T148XXA Other injury of unspecified body region, initial encounter: Secondary | ICD-10-CM

## 2015-05-14 DIAGNOSIS — F32A Depression, unspecified: Secondary | ICD-10-CM

## 2015-05-14 DIAGNOSIS — F603 Borderline personality disorder: Secondary | ICD-10-CM | POA: Diagnosis not present

## 2015-05-14 DIAGNOSIS — S199XXA Unspecified injury of neck, initial encounter: Secondary | ICD-10-CM | POA: Diagnosis not present

## 2015-05-14 DIAGNOSIS — Z7982 Long term (current) use of aspirin: Secondary | ICD-10-CM | POA: Diagnosis not present

## 2015-05-14 DIAGNOSIS — Z90710 Acquired absence of both cervix and uterus: Secondary | ICD-10-CM | POA: Insufficient documentation

## 2015-05-14 DIAGNOSIS — R519 Headache, unspecified: Secondary | ICD-10-CM

## 2015-05-14 HISTORY — DX: Anxiety disorder, unspecified: F41.9

## 2015-05-14 HISTORY — DX: Major depressive disorder, single episode, unspecified: F32.9

## 2015-05-14 HISTORY — DX: Depression, unspecified: F32.A

## 2015-05-14 LAB — COMPREHENSIVE METABOLIC PANEL
ALBUMIN: 3.7 g/dL (ref 3.5–5.0)
ALT: 12 U/L — ABNORMAL LOW (ref 14–54)
ANION GAP: 9 (ref 5–15)
AST: 19 U/L (ref 15–41)
Alkaline Phosphatase: 140 U/L — ABNORMAL HIGH (ref 38–126)
BUN: 16 mg/dL (ref 6–20)
CALCIUM: 9.3 mg/dL (ref 8.9–10.3)
CHLORIDE: 106 mmol/L (ref 101–111)
CO2: 25 mmol/L (ref 22–32)
CREATININE: 1.27 mg/dL — AB (ref 0.44–1.00)
GFR calc Af Amer: 48 mL/min — ABNORMAL LOW (ref >60)
GFR calc non Af Amer: 41 mL/min — ABNORMAL LOW (ref >60)
Glucose, Bld: 127 mg/dL — ABNORMAL HIGH (ref 65–99)
Potassium: 3.7 mmol/L (ref 3.5–5.1)
Sodium: 140 mmol/L (ref 135–145)
TOTAL PROTEIN: 7.6 g/dL (ref 6.5–8.1)
Total Bilirubin: 0.4 mg/dL (ref 0.3–1.2)

## 2015-05-14 LAB — CBC
HCT: 41.1 % (ref 35.0–47.0)
HEMOGLOBIN: 13.5 g/dL (ref 12.0–16.0)
MCH: 29.1 pg (ref 26.0–34.0)
MCHC: 33 g/dL (ref 32.0–36.0)
MCV: 88.1 fL (ref 80.0–100.0)
Platelets: 270 10*3/uL (ref 150–440)
RBC: 4.66 MIL/uL (ref 3.80–5.20)
RDW: 16 % — ABNORMAL HIGH (ref 11.5–14.5)
WBC: 9.6 10*3/uL (ref 3.6–11.0)

## 2015-05-14 LAB — URINALYSIS COMPLETE WITH MICROSCOPIC (ARMC ONLY)
BILIRUBIN URINE: NEGATIVE
GLUCOSE, UA: NEGATIVE mg/dL
LEUKOCYTES UA: NEGATIVE
Nitrite: POSITIVE — AB
Protein, ur: NEGATIVE mg/dL
Specific Gravity, Urine: 1.015 (ref 1.005–1.030)
pH: 5 (ref 5.0–8.0)

## 2015-05-14 LAB — URINE DRUG SCREEN, QUALITATIVE (ARMC ONLY)
Amphetamines, Ur Screen: NOT DETECTED
Barbiturates, Ur Screen: NOT DETECTED
Benzodiazepine, Ur Scrn: POSITIVE — AB
CANNABINOID 50 NG, UR ~~LOC~~: NOT DETECTED
COCAINE METABOLITE, UR ~~LOC~~: NOT DETECTED
MDMA (ECSTASY) UR SCREEN: NOT DETECTED
Methadone Scn, Ur: NOT DETECTED
Opiate, Ur Screen: NOT DETECTED
Phencyclidine (PCP) Ur S: NOT DETECTED
TRICYCLIC, UR SCREEN: POSITIVE — AB

## 2015-05-14 LAB — MAGNESIUM: Magnesium: 1.6 mg/dL — ABNORMAL LOW (ref 1.7–2.4)

## 2015-05-14 LAB — TROPONIN I: Troponin I: <0.03 ng/mL (ref <0.031)

## 2015-05-14 MED ORDER — ACETAMINOPHEN 650 MG RE SUPP
650.0000 mg | Freq: Four times a day (QID) | RECTAL | Status: DC | PRN
Start: 2015-05-14 — End: 2015-05-16

## 2015-05-14 MED ORDER — ONDANSETRON HCL 4 MG PO TABS: 4.0000 mg | ORAL_TABLET | Freq: Four times a day (QID) | ORAL | Status: DC | PRN

## 2015-05-14 MED ORDER — SODIUM CHLORIDE 0.9 % IJ SOLN
3.0000 mL | Freq: Two times a day (BID) | INTRAMUSCULAR | Status: DC
  Administered 2015-05-15 – 2015-05-16 (×3): 3 mL via INTRAVENOUS

## 2015-05-14 MED ORDER — ACETAMINOPHEN 325 MG PO TABS
650.0000 mg | ORAL_TABLET | Freq: Four times a day (QID) | ORAL | Status: DC | PRN
  Filled 2015-05-14: qty 2

## 2015-05-14 MED ORDER — ACETAMINOPHEN 325 MG PO TABS
650.0000 mg | ORAL_TABLET | Freq: Once | ORAL | Status: AC
  Administered 2015-05-14: 650 mg via ORAL

## 2015-05-14 MED ORDER — HEPARIN SODIUM (PORCINE) 5000 UNIT/ML IJ SOLN
5000.0000 [IU] | Freq: Three times a day (TID) | INTRAMUSCULAR | Status: DC
  Administered 2015-05-15 – 2015-05-16 (×4): 5000 [IU] via SUBCUTANEOUS
  Filled 2015-05-14 (×4): qty 1

## 2015-05-14 MED ORDER — ONDANSETRON HCL 4 MG/2ML IJ SOLN: 4.0000 mg | Freq: Four times a day (QID) | INTRAMUSCULAR | Status: DC | PRN

## 2015-05-14 MED ORDER — DEXTROSE 5 % IV SOLN
1.0000 g | INTRAVENOUS | Status: AC
  Administered 2015-05-15: 1 g via INTRAVENOUS
  Filled 2015-05-14: qty 10

## 2015-05-14 MED ORDER — ACETAMINOPHEN 325 MG PO TABS
ORAL_TABLET | ORAL | Status: AC
  Filled 2015-05-14: qty 2

## 2015-05-14 MED ORDER — OXYCODONE HCL 5 MG PO TABS
5.0000 mg | ORAL_TABLET | ORAL | Status: DC | PRN
  Administered 2015-05-15 – 2015-05-16 (×4): 5 mg via ORAL
  Filled 2015-05-14 (×4): qty 1

## 2015-05-14 NOTE — ED Provider Notes (Signed)
[facility] Emergency Department Provider Note  ____________________________________________  Time seen: Approximately 8:56 PM  I have reviewed the triage vital signs and the nursing notes.   HISTORY  Chief Complaint Drug Overdose and Fall    HPI Jill Durham is a 69 y.o. female with a history of diabetes, heart disease status post MI, seizure disorder, hypertension, anxiety and depression, and prior CVA who presents after a syncopal episode, allegedly accidental overdose of Ativan, and pain in her head, neck, and bilateral hips after her syncopal episode/fall.  She reports that she awoke this morning by falling out of bed.  She did not have any specific complaints at that time except for a mild headache.  Later she had an argument with her husband and wanted to relax and go to sleep, so she took "at least" 10 Ativan 1mg  tablets.  (She seemed surprised when I told her this was too much.)  Some time later she was walking outside "to find my kitten" and she reports that she passed out.  The fall that time caused her to have persistent sharp pain in her neck, head, and bilateral hips.    She denies chest pain, abdominal pain, nausea/vomiting.  She does not report any lacerations or difficulty moving her limbs, just pain in both hips when she walks.  She repeatedly and adamantly denies suicidal ideation or that this was an intentional overdose.  She states that she is just tired and wanted to sleep.  According to her medication bottle filled date, she should have had 16 tablets left, but the bottle is empty.   Past Medical History  Diagnosis Date  . Diabetes mellitus without complication   . MI, old   . Seizures   . Hypertension   . Stroke     tia  . Anxiety and depression     Patient Active Problem List   Diagnosis Date Noted  . Secondary hypertension, unspecified 08/20/2014  . Acute kidney injury 08/20/2014  . UTI (lower urinary tract infection)  08/20/2014  . H/O: CVA (cerebrovascular accident) 08/20/2014  . History of MI (myocardial infarction) 08/20/2014  . Tobacco abuse 08/20/2014  . Syncope 08/18/2014  . Headache 08/18/2014  . Diabetes type 2, controlled 08/18/2014  . Hypertensive emergency 08/18/2014    Past Surgical History  Procedure Laterality Date  . Abdominal surgery    . Bladder surgery    . Rectal surgery    . Abdominal hysterectomy      partial  . Cardiac surgery      CABG    Current Outpatient Rx  Name  Route  Sig  Dispense  Refill  . ALPRAZolam (XANAX) 0.25 MG tablet   Oral   Take 0.25 mg by mouth daily as needed for anxiety.         Marland Kitchen amitriptyline (ELAVIL) 75 MG tablet   Oral   Take 75 mg by mouth at bedtime.         Marland Kitchen amLODipine (NORVASC) 5 MG tablet   Oral   Take 5 mg by mouth daily.         Marland Kitchen aspirin EC 81 MG tablet   Oral   Take 81 mg by mouth 2 (two) times daily.         . diphenhydrAMINE (BENADRYL) 25 MG tablet   Oral   Take 50 mg by mouth daily as needed (for emergency purpose).         Marland Kitchen EPIPEN 2-PAK 0.3 MG/0.3ML SOAJ injection               .  FLUoxetine (PROZAC) 40 MG capsule   Oral   Take 40 mg by mouth daily.         . Multiple Vitamins-Minerals (ADULT GUMMY) CHEW   Oral   Chew 1 tablet by mouth daily.         Marland Kitchen omeprazole (PRILOSEC) 40 MG capsule   Oral   Take 40 mg by mouth 2 (two) times daily.         . simvastatin (ZOCOR) 20 MG tablet   Oral   Take 20 mg by mouth at bedtime.           Allergies Food and Erythromycin  No family history on file.  Social History Social History  Substance Use Topics  . Smoking status: Current Every Day Smoker -- 1.00 packs/day    Types: Cigarettes  . Smokeless tobacco: None  . Alcohol Use: No    Review of Systems Constitutional: No fever/chills.  Somnolent. Eyes: No visual changes. ENT: No sore throat. Cardiovascular: Denies chest pain. Respiratory: Denies shortness of  breath. Gastrointestinal: No abdominal pain.  No nausea, no vomiting.  No diarrhea.  No constipation. Genitourinary: Negative for dysuria. Musculoskeletal: Pain in both hips, neck, and head Skin: Negative for rash. Neurological: Positive for headache with no focal weakness or numbness. Psychiatric:Denies SI/HI and denies intentional overdose 10-point ROS otherwise negative.  ____________________________________________   PHYSICAL EXAM:  ED Triage Vitals  Enc Vitals Group     BP 05/14/15 2230 153/71 mmHg     Pulse Rate 05/14/15 2032 87     Resp 05/14/15 2032 13     Temp 05/14/15 2032 98.2 F (36.8 C)     Temp Source 05/14/15 2032 Oral     SpO2 05/14/15 2029 96 %     Weight --      Height --      Head Cir --      Peak Flow --      Pain Score 05/14/15 2034 10     Pain Loc --      Pain Edu? --      Excl. in GC? --       Constitutional: Alert and oriented but somnolent with "heavy eyelids".  No acute distress. Eyes: Conjunctivae are normal. PERRL. EOMI. Head: Atraumatic. Nose: No congestion/rhinnorhea. Mouth/Throat: Mucous membranes are moist.  Oropharynx non-erythematous. Neck: No stridor.  Tenderness to palpation of neck but more likely soft tissue; no bony point-tenderness. Cardiovascular: Normal rate, regular rhythm. Grossly normal heart sounds.  Good peripheral circulation. Respiratory: Normal respiratory effort.  No retractions. Lungs CTAB. Gastrointestinal: Soft and nontender. No distention. No abdominal bruits. No CVA tenderness. Musculoskeletal: Normal range of motion without tenderness to upper extremities.  Mild tenderness to palpation of bilateral hips but no reproducible pain with range of motion of hips and knees.  No sternal evidence of contusion. Neurologic:  Speech is slightly slurred.  No gross neurological deficits.   Skin:  Skin is warm, dry and intact. No rash noted. Psychiatric: Patient is somnolent but her mood and affect are otherwise normal.  She  denies SI/HI and denies intentional overdose.  ____________________________________________   LABS (all labs ordered are listed, but only abnormal results are displayed)  Labs Reviewed  COMPREHENSIVE METABOLIC PANEL - Abnormal; Notable for the following:    Glucose, Bld 127 (*)    Creatinine, Ser 1.27 (*)    ALT 12 (*)    Alkaline Phosphatase 140 (*)    GFR calc non Af Amer 41 (*)  GFR calc Af Amer 48 (*)    All other components within normal limits  CBC - Abnormal; Notable for the following:    RDW 16.0 (*)    All other components within normal limits  URINE DRUG SCREEN, QUALITATIVE ([facility] ONLY) - Abnormal; Notable for the following:    Tricyclic, Ur Screen POSITIVE (*)    Benzodiazepine, Ur Scrn POSITIVE (*)    All other components within normal limits  URINALYSIS COMPLETEWITH MICROSCOPIC ([facility] ONLY) - Abnormal; Notable for the following:    Color, Urine YELLOW (*)    APPearance HAZY (*)    Ketones, ur TRACE (*)    Hgb urine dipstick 1+ (*)    Nitrite POSITIVE (*)    Bacteria, UA RARE (*)    Squamous Epithelial / LPF 0-5 (*)    All other components within normal limits  MAGNESIUM - Abnormal; Notable for the following:    Magnesium 1.6 (*)    All other components within normal limits  URINE CULTURE  TROPONIN I   ____________________________________________  EKG  ED ECG REPORT I, Isreal Moline, the attending physician, personally viewed and interpreted this ECG.   Date: 05/14/2015  EKG Time: 20:31  Rate: 88  Rhythm: normal sinus rhythm  Axis: Normal  Intervals:Normal  ST&T Change: Non-specific ST segment / T-wave changes, but no evidence of acute ischemia.   ____________________________________________  RADIOLOGY  Ct Head Wo Contrast  05/14/2015   CLINICAL DATA:  69 year old female with history of drug overdose and fall. Injury to the head.  EXAM: CT HEAD WITHOUT CONTRAST  CT CERVICAL SPINE WITHOUT CONTRAST  TECHNIQUE: Multidetector CT imaging of the head  and cervical spine was performed following the standard protocol without intravenous contrast. Multiplanar CT image reconstructions of the cervical spine were also generated.  COMPARISON:  Cervical spine CT scan 01/27/2015.  Head CT 01/27/2015.  FINDINGS: CT HEAD FINDINGS  Mild cerebral atrophy. Patchy and confluent areas of decreased attenuation are noted throughout the deep and periventricular white matter of the cerebral hemispheres bilaterally, compatible with chronic microvascular ischemic disease. Multiple old right basal ganglia lacunar infarcts appear similar to prior examinations. No acute displaced skull fractures are identified. No acute intracranial abnormality. Specifically, no evidence of acute post-traumatic intracranial hemorrhage, no definite regions of acute/subacute cerebral ischemia, no focal mass, mass effect, hydrocephalus or abnormal intra or extra-axial fluid collections. The visualized paranasal sinuses and mastoids are well pneumatized.  CT CERVICAL SPINE FINDINGS  No acute displaced fractures of the cervical spine. Alignment is anatomic. Prevertebral soft tissues are normal. Multilevel degenerative disc disease, most severe at C3-C4, C4-C5, C5-C6 and C6-C7. Multilevel facet arthropathy. Visualized portions of the upper thorax are unremarkable.  IMPRESSION: 1. No evidence of significant acute traumatic injury to the skull, brain or cervical spine. 2. Mild cerebral atrophy with chronic microvascular ischemic changes in the cerebral white matter and multiple old right basal ganglia lacunar infarctions. 3. Multilevel degenerative disc disease and cervical spondylosis, as above.   Electronically Signed   By: Trudie Reed M.D.   On: 05/14/2015 21:38   Ct Cervical Spine Wo Contrast  05/14/2015   CLINICAL DATA:  69 year old female with history of drug overdose and fall. Injury to the head.  EXAM: CT HEAD WITHOUT CONTRAST  CT CERVICAL SPINE WITHOUT CONTRAST  TECHNIQUE: Multidetector CT  imaging of the head and cervical spine was performed following the standard protocol without intravenous contrast. Multiplanar CT image reconstructions of the cervical spine were also generated.  COMPARISON:  Cervical  spine CT scan 01/27/2015.  Head CT 01/27/2015.  FINDINGS: CT HEAD FINDINGS  Mild cerebral atrophy. Patchy and confluent areas of decreased attenuation are noted throughout the deep and periventricular white matter of the cerebral hemispheres bilaterally, compatible with chronic microvascular ischemic disease. Multiple old right basal ganglia lacunar infarcts appear similar to prior examinations. No acute displaced skull fractures are identified. No acute intracranial abnormality. Specifically, no evidence of acute post-traumatic intracranial hemorrhage, no definite regions of acute/subacute cerebral ischemia, no focal mass, mass effect, hydrocephalus or abnormal intra or extra-axial fluid collections. The visualized paranasal sinuses and mastoids are well pneumatized.  CT CERVICAL SPINE FINDINGS  No acute displaced fractures of the cervical spine. Alignment is anatomic. Prevertebral soft tissues are normal. Multilevel degenerative disc disease, most severe at C3-C4, C4-C5, C5-C6 and C6-C7. Multilevel facet arthropathy. Visualized portions of the upper thorax are unremarkable.  IMPRESSION: 1. No evidence of significant acute traumatic injury to the skull, brain or cervical spine. 2. Mild cerebral atrophy with chronic microvascular ischemic changes in the cerebral white matter and multiple old right basal ganglia lacunar infarctions. 3. Multilevel degenerative disc disease and cervical spondylosis, as above.   Electronically Signed   By: Trudie Reed M.D.   On: 05/14/2015 21:38   Dg Hip Unilat With Pelvis 1v Left  05/14/2015   CLINICAL DATA:  Acute onset of bilateral hip pain. Recent falls. Initial encounter.  EXAM: DG HIP (WITH OR WITHOUT PELVIS) 1V*L*  COMPARISON:  None.  FINDINGS: There is no  evidence of fracture or dislocation. Both femoral heads are seated normally within their respective acetabula. The proximal left femur appears intact. Mild degenerative change is noted at the lower lumbar spine. The sacroiliac joints are unremarkable in appearance.  The visualized bowel gas pattern is grossly unremarkable in appearance. The patient is status post tubal ligation.  IMPRESSION: No evidence of fracture or dislocation.   Electronically Signed   By: Roanna Raider M.D.   On: 05/14/2015 21:52   Dg Hip Unilat With Pelvis 1v Right  05/14/2015   CLINICAL DATA:  Acute onset of bilateral hip pain. Recent falls. Initial encounter.  EXAM: DG HIP (WITH OR WITHOUT PELVIS) 1V RIGHT  COMPARISON:  Right hip radiographs performed 04/20/2015  FINDINGS: There is no evidence of fracture or dislocation. Both femoral heads are seated normally within their respective acetabula, on correlation with concurrent pelvic radiograph. The proximal right femur appears intact. The sacroiliac joints are unremarkable in appearance.  The visualized bowel gas pattern is grossly unremarkable in appearance. The patient is status post tubal ligation.  IMPRESSION: No evidence of fracture or dislocation.   Electronically Signed   By: Roanna Raider M.D.   On: 05/14/2015 21:53    ____________________________________________   PROCEDURES  Procedure(s) performed: None  Critical Care performed: No ____________________________________________   INITIAL IMPRESSION / ASSESSMENT AND PLAN / ED COURSE  Pertinent labs & imaging results that were available during my care of the patient were reviewed by me and considered in my medical decision making (see chart for details).  The patient has been stable in the emergency department for several hours.  She has no acute injury found on CT head/CT C-spine and her pelvis and hip radiographs are normal.  I initially placed her in a c-collar but cleared her clinically after receiving the  reassuring radiology results.  However, she had a syncopal episode in the setting of a significant Ativan overdose, a mild UTI, persistent headache, and persistent somnolence.  I do not  believe there is an indication for talking to poison control given that it is a "simple" benzodiazepine overdose.  I also believe her when she states that this was not intentional and does not represent a suicide attempt; she is generally confused about appropriate dosing of Ativan.  However, do not believe she is safe to go home at this time I will admit her to the hospital for observation for her syncope and benzodiazepine overdose.  ____________________________________________  FINAL CLINICAL IMPRESSION(S) / ED DIAGNOSES  Final diagnoses:  Benzodiazepine (tranquilizer) overdose, accidental or unintentional, initial encounter  Syncope, unspecified syncope type  Muscle strain  Acute nonintractable headache, unspecified headache type  UTI (urinary tract infection), uncomplicated      NEW MEDICATIONS STARTED DURING THIS VISIT:  New Prescriptions   No medications on file     Loleta Rose, MD 05/14/15 2312

## 2015-05-14 NOTE — ED Notes (Signed)
Report from Tobi Bastos and [place], [place]

## 2015-05-14 NOTE — ED Notes (Signed)
Patient transported to CT 

## 2015-05-14 NOTE — ED Notes (Addendum)
Pt brought from home via EMS w/ c/o of OD and fall. Per EMS, pt est taking at least 10 Ativan at approx 1500.  Pt denies SI, pt sts  "I was really tired and I wanted to sleep". Per EMS pt has fallen twice today, once at 1815 in bedroom and another before EMS called in yard.  Pt arrived alert and oriented.  Pts resp even and unlabored.  Pt sts that she hit her head.  Pt has hx of afib, denies taking med for condition and has had CABG.

## 2015-05-15 ENCOUNTER — Observation Stay: Payer: Commercial Managed Care - HMO

## 2015-05-15 DIAGNOSIS — R42 Dizziness and giddiness: Secondary | ICD-10-CM | POA: Diagnosis not present

## 2015-05-15 DIAGNOSIS — R51 Headache: Secondary | ICD-10-CM | POA: Diagnosis not present

## 2015-05-15 DIAGNOSIS — R55 Syncope and collapse: Secondary | ICD-10-CM | POA: Diagnosis not present

## 2015-05-15 DIAGNOSIS — T424X2A Poisoning by benzodiazepines, intentional self-harm, initial encounter: Secondary | ICD-10-CM

## 2015-05-15 DIAGNOSIS — R0609 Other forms of dyspnea: Secondary | ICD-10-CM | POA: Diagnosis not present

## 2015-05-15 DIAGNOSIS — N289 Disorder of kidney and ureter, unspecified: Secondary | ICD-10-CM | POA: Diagnosis not present

## 2015-05-15 DIAGNOSIS — T424X1A Poisoning by benzodiazepines, accidental (unintentional), initial encounter: Secondary | ICD-10-CM | POA: Diagnosis not present

## 2015-05-15 DIAGNOSIS — S0990XA Unspecified injury of head, initial encounter: Secondary | ICD-10-CM | POA: Diagnosis not present

## 2015-05-15 DIAGNOSIS — M542 Cervicalgia: Secondary | ICD-10-CM | POA: Diagnosis not present

## 2015-05-15 DIAGNOSIS — M25551 Pain in right hip: Secondary | ICD-10-CM | POA: Diagnosis not present

## 2015-05-15 DIAGNOSIS — M25552 Pain in left hip: Secondary | ICD-10-CM | POA: Diagnosis not present

## 2015-05-15 DIAGNOSIS — R531 Weakness: Secondary | ICD-10-CM | POA: Diagnosis not present

## 2015-05-15 LAB — GLUCOSE, CAPILLARY
GLUCOSE-CAPILLARY: 107 mg/dL — AB (ref 65–99)
GLUCOSE-CAPILLARY: 124 mg/dL — AB (ref 65–99)
Glucose-Capillary: 122 mg/dL — ABNORMAL HIGH (ref 65–99)
Glucose-Capillary: 144 mg/dL — ABNORMAL HIGH (ref 65–99)

## 2015-05-15 MED ORDER — AMLODIPINE BESYLATE 5 MG PO TABS
5.0000 mg | ORAL_TABLET | Freq: Every day | ORAL | Status: DC
  Administered 2015-05-15 – 2015-05-16 (×2): 5 mg via ORAL
  Filled 2015-05-15 (×2): qty 1

## 2015-05-15 MED ORDER — AMITRIPTYLINE HCL 75 MG PO TABS
75.0000 mg | ORAL_TABLET | Freq: Every day | ORAL | Status: DC
  Administered 2015-05-15: 75 mg via ORAL
  Filled 2015-05-15 (×3): qty 1

## 2015-05-15 MED ORDER — INSULIN ASPART 100 UNIT/ML ~~LOC~~ SOLN
0.0000 [IU] | Freq: Three times a day (TID) | SUBCUTANEOUS | Status: DC
  Administered 2015-05-15 (×3): 2 [IU] via SUBCUTANEOUS
  Administered 2015-05-16: 3 [IU] via SUBCUTANEOUS
  Administered 2015-05-16: 2 [IU] via SUBCUTANEOUS
  Filled 2015-05-15 (×4): qty 2
  Filled 2015-05-15: qty 3

## 2015-05-15 MED ORDER — FUROSEMIDE 40 MG PO TABS
40.0000 mg | ORAL_TABLET | Freq: Once | ORAL | Status: AC
  Administered 2015-05-15: 40 mg via ORAL
  Filled 2015-05-15: qty 1

## 2015-05-15 MED ORDER — GLUCERNA SHAKE PO LIQD
237.0000 mL | Freq: Three times a day (TID) | ORAL | Status: DC
  Administered 2015-05-15 – 2015-05-16 (×3): 237 mL via ORAL

## 2015-05-15 MED ORDER — ADULT GUMMY PO CHEW: 1.0000 | CHEWABLE_TABLET | Freq: Every day | ORAL | Status: DC

## 2015-05-15 MED ORDER — INSULIN ASPART 100 UNIT/ML ~~LOC~~ SOLN: 0.0000 [IU] | Freq: Every day | SUBCUTANEOUS | Status: DC

## 2015-05-15 MED ORDER — DEXTROSE 5 % IV SOLN
1.0000 g | INTRAVENOUS | Status: DC
  Administered 2015-05-16: 1 g via INTRAVENOUS
  Filled 2015-05-15 (×3): qty 10

## 2015-05-15 MED ORDER — PANTOPRAZOLE SODIUM 40 MG PO TBEC
40.0000 mg | DELAYED_RELEASE_TABLET | Freq: Every day | ORAL | Status: DC
  Administered 2015-05-15 – 2015-05-16 (×2): 40 mg via ORAL
  Filled 2015-05-15 (×2): qty 1

## 2015-05-15 MED ORDER — ASPIRIN EC 81 MG PO TBEC
81.0000 mg | DELAYED_RELEASE_TABLET | Freq: Two times a day (BID) | ORAL | Status: DC
  Administered 2015-05-15 – 2015-05-16 (×3): 81 mg via ORAL
  Filled 2015-05-15 (×3): qty 1

## 2015-05-15 MED ORDER — SIMVASTATIN 20 MG PO TABS
20.0000 mg | ORAL_TABLET | Freq: Every day | ORAL | Status: DC
  Administered 2015-05-15: 20 mg via ORAL
  Filled 2015-05-15: qty 1

## 2015-05-15 MED ORDER — FLUOXETINE HCL 20 MG PO CAPS
40.0000 mg | ORAL_CAPSULE | Freq: Every day | ORAL | Status: DC
  Administered 2015-05-15 – 2015-05-16 (×2): 40 mg via ORAL
  Filled 2015-05-15 (×2): qty 2

## 2015-05-15 MED ORDER — ADULT MULTIVITAMIN W/MINERALS CH
1.0000 | ORAL_TABLET | Freq: Every day | ORAL | Status: DC
  Administered 2015-05-15 – 2015-05-16 (×2): 1 via ORAL
  Filled 2015-05-15 (×2): qty 1

## 2015-05-15 NOTE — ED Notes (Signed)
Patient requesting pain medicine for a headache. Was advised by MD that she had taken too many Ativan and remained too sedate therefore she could only have Tylenol for the headache. Patient refused the Tylenol stating she didn't want it, she wanted something stronger.

## 2015-05-15 NOTE — ED Provider Notes (Signed)
-----------------------------------------   11:30 PM on 05/14/2015 -----------------------------------------  The patient was very difficult to establish a peripheral IV.  I was told that the nurses had tried 7 times one unable to start an IV.  Her nurse and I went back into the room and failed again a total of approximately 4 times, including a left EJ.  I then attempted to place a peripheral IV in her left IJ but was concerned when the draw back blood appeared pulsatile.  (Both the attempts in her neck were ultrasound-guided.)  This may have been because she is in Trendelenburg with slight hypertension, but I felt that it was not worth risking a carotid PIV.  I applied direct pressure for approximately 10 minutes and she had no evidence of neck hematoma or bleeding.  Subsequently her nurse was able to establish a peripheral IV in her foot after I requested that she do so.  Loleta Rose, MD 05/15/15 [phone]

## 2015-05-15 NOTE — Progress Notes (Signed)
[facility] [place] [facility]   PATIENT NAME: Jill Durham    MR#:  [id number]  DATE OF BIRTH:  1946-08-05  SUBJECTIVE:  CHIEF COMPLAINT:   Chief Complaint  Patient presents with  . Drug Overdose  . Fall   patient is 69 year old Caucasian female with past medical history significant for history of essential hypertension, anxiety, questionable congestive heart failure who presents to the hospital with  Falls. Apparently patient felt anxious and jittery wanted to sleep so she took her 10-16, pulse of 1 mg of Ativan. After taking Ativan she had a fall with head trauma, but without loss of consciousness. Due to fall. She presented to the hospital for further workup and management. She admits of multiple medical problems and concerns. She admits of cough, some shortness of breath especially on exertion, nausea, intermittent diarrhea, constipation as well as intermittent abdominal pains centered dysuria intermittently, back pain, joint pain in the left hip joint also weakness  Review of Systems  Constitutional: Negative for fever, chills and weight loss.  HENT: Negative for congestion.   Eyes: Negative for blurred vision and double vision.  Respiratory: Positive for cough and shortness of breath. Negative for sputum production and wheezing.   Cardiovascular: Negative for chest pain, palpitations, orthopnea, leg swelling and PND.  Gastrointestinal: Positive for nausea, abdominal pain, diarrhea and constipation. Negative for vomiting and blood in stool.  Genitourinary: Positive for dysuria. Negative for urgency, frequency and hematuria.  Musculoskeletal: Positive for back pain and joint pain. Negative for falls.  Neurological: Positive for weakness. Negative for dizziness, tremors, focal weakness and headaches.  Endo/Heme/Allergies: Does not bruise/bleed easily.  Psychiatric/Behavioral: Negative for depression. The patient does not have insomnia.     VITAL  SIGNS: Blood pressure 146/62, pulse 92, temperature 97.4 F (36.3 C), temperature source Oral, resp. rate 21, height  (1.651 m), weight 69.627 kg (153 lb 8 oz), SpO2 91 %.  PHYSICAL EXAMINATION:   GENERAL:  69 y.o.-year-old patient lying in the bed with no acute distress.  EYES: Pupils equal, round, reactive to light and accommodation. No scleral icterus. Extraocular muscles intact.  HEENT: Head atraumatic, normocephalic. Oropharynx and nasopharynx clear.  NECK:  Supple, no jugular venous distention. No thyroid enlargement, no tenderness.  LUNGS: Normal breath sounds bilaterally, no wheezing, rales,rhonchi , but he has crepitations at bases bilaterally with deep inspirations. No use of accessory muscles of respiration.  CARDIOVASCULAR: S1, S2 normal. No murmurs, rubs, or gallops.  ABDOMEN: Soft, nontender, nondistended. Bowel sounds present. No organomegaly or mass.  EXTREMITIES: No pedal edema, cyanosis, or clubbing.  NEUROLOGIC: Cranial nerves II through XII are intact. Muscle strength 5/5 in all extremities. Sensation intact. Gait not checked.  PSYCHIATRIC: The patient is alert and oriented x 3.  SKIN: No obvious rash, lesion, or ulcer.   ORDERS/RESULTS REVIEWED:   CBC  Recent Labs Lab 05/14/15 2041  WBC 9.6  HGB 13.5  HCT 41.1  PLT 270  MCV 88.1  MCH 29.1  MCHC 33.0  RDW 16.0*   ------------------------------------------------------------------------------------------------------------------  Chemistries   Recent Labs Lab 05/14/15 2041  NA 140  K 3.7  CL 106  CO2 25  GLUCOSE 127*  BUN 16  CREATININE 1.27*  CALCIUM 9.3  MG 1.6*  AST 19  ALT 12*  ALKPHOS 140*  BILITOT 0.4   ------------------------------------------------------------------------------------------------------------------ estimated creatinine clearance is 39.8 mL/min (by C-G formula based on Cr of  1.27). ------------------------------------------------------------------------------------------------------------------ No results for input(s): TSH, T4TOTAL, T3FREE, THYROIDAB in the  last 72 hours.  Invalid input(s): FREET3  Cardiac Enzymes  Recent Labs Lab 05/14/15 2041  TROPONINI <0.03   ------------------------------------------------------------------------------------------------------------------ Invalid input(s): POCBNP ---------------------------------------------------------------------------------------------------------------  RADIOLOGY: Ct Head Wo Contrast  05/14/2015   CLINICAL DATA:  69 year old female with history of drug overdose and fall. Injury to the head.  EXAM: CT HEAD WITHOUT CONTRAST  CT CERVICAL SPINE WITHOUT CONTRAST  TECHNIQUE: Multidetector CT imaging of the head and cervical spine was performed following the standard protocol without intravenous contrast. Multiplanar CT image reconstructions of the cervical spine were also generated.  COMPARISON:  Cervical spine CT scan 01/27/2015.  Head CT 01/27/2015.  FINDINGS: CT HEAD FINDINGS  Mild cerebral atrophy. Patchy and confluent areas of decreased attenuation are noted throughout the deep and periventricular white matter of the cerebral hemispheres bilaterally, compatible with chronic microvascular ischemic disease. Multiple old right basal ganglia lacunar infarcts appear similar to prior examinations. No acute displaced skull fractures are identified. No acute intracranial abnormality. Specifically, no evidence of acute post-traumatic intracranial hemorrhage, no definite regions of acute/subacute cerebral ischemia, no focal mass, mass effect, hydrocephalus or abnormal intra or extra-axial fluid collections. The visualized paranasal sinuses and mastoids are well pneumatized.  CT CERVICAL SPINE FINDINGS  No acute displaced fractures of the cervical spine. Alignment is anatomic. Prevertebral soft tissues are normal.  Multilevel degenerative disc disease, most severe at C3-C4, C4-C5, C5-C6 and C6-C7. Multilevel facet arthropathy. Visualized portions of the upper thorax are unremarkable.  IMPRESSION: 1. No evidence of significant acute traumatic injury to the skull, brain or cervical spine. 2. Mild cerebral atrophy with chronic microvascular ischemic changes in the cerebral white matter and multiple old right basal ganglia lacunar infarctions. 3. Multilevel degenerative disc disease and cervical spondylosis, as above.   Electronically Signed   By: Trudie Reed M.D.   On: 05/14/2015 21:38   Ct Cervical Spine Wo Contrast  05/14/2015   CLINICAL DATA:  69 year old female with history of drug overdose and fall. Injury to the head.  EXAM: CT HEAD WITHOUT CONTRAST  CT CERVICAL SPINE WITHOUT CONTRAST  TECHNIQUE: Multidetector CT imaging of the head and cervical spine was performed following the standard protocol without intravenous contrast. Multiplanar CT image reconstructions of the cervical spine were also generated.  COMPARISON:  Cervical spine CT scan 01/27/2015.  Head CT 01/27/2015.  FINDINGS: CT HEAD FINDINGS  Mild cerebral atrophy. Patchy and confluent areas of decreased attenuation are noted throughout the deep and periventricular white matter of the cerebral hemispheres bilaterally, compatible with chronic microvascular ischemic disease. Multiple old right basal ganglia lacunar infarcts appear similar to prior examinations. No acute displaced skull fractures are identified. No acute intracranial abnormality. Specifically, no evidence of acute post-traumatic intracranial hemorrhage, no definite regions of acute/subacute cerebral ischemia, no focal mass, mass effect, hydrocephalus or abnormal intra or extra-axial fluid collections. The visualized paranasal sinuses and mastoids are well pneumatized.  CT CERVICAL SPINE FINDINGS  No acute displaced fractures of the cervical spine. Alignment is anatomic. Prevertebral soft  tissues are normal. Multilevel degenerative disc disease, most severe at C3-C4, C4-C5, C5-C6 and C6-C7. Multilevel facet arthropathy. Visualized portions of the upper thorax are unremarkable.  IMPRESSION: 1. No evidence of significant acute traumatic injury to the skull, brain or cervical spine. 2. Mild cerebral atrophy with chronic microvascular ischemic changes in the cerebral white matter and multiple old right basal ganglia lacunar infarctions. 3. Multilevel degenerative disc disease and cervical spondylosis, as above.   Electronically Signed   By: Brayton Mars.D.  On: 05/14/2015 21:38   Dg Hip Unilat With Pelvis 1v Left  05/14/2015   CLINICAL DATA:  Acute onset of bilateral hip pain. Recent falls. Initial encounter.  EXAM: DG HIP (WITH OR WITHOUT PELVIS) 1V*L*  COMPARISON:  None.  FINDINGS: There is no evidence of fracture or dislocation. Both femoral heads are seated normally within their respective acetabula. The proximal left femur appears intact. Mild degenerative change is noted at the lower lumbar spine. The sacroiliac joints are unremarkable in appearance.  The visualized bowel gas pattern is grossly unremarkable in appearance. The patient is status post tubal ligation.  IMPRESSION: No evidence of fracture or dislocation.   Electronically Signed   By: Roanna Raider M.D.   On: 05/14/2015 21:52   Dg Hip Unilat With Pelvis 1v Right  05/14/2015   CLINICAL DATA:  Acute onset of bilateral hip pain. Recent falls. Initial encounter.  EXAM: DG HIP (WITH OR WITHOUT PELVIS) 1V RIGHT  COMPARISON:  Right hip radiographs performed 04/20/2015  FINDINGS: There is no evidence of fracture or dislocation. Both femoral heads are seated normally within their respective acetabula, on correlation with concurrent pelvic radiograph. The proximal right femur appears intact. The sacroiliac joints are unremarkable in appearance.  The visualized bowel gas pattern is grossly unremarkable in appearance. The patient is  status post tubal ligation.  IMPRESSION: No evidence of fracture or dislocation.   Electronically Signed   By: Roanna Raider M.D.   On: 05/14/2015 21:53    EKG:  Orders placed or performed during the hospital encounter of 05/14/15  . ED EKG  . ED EKG  . EKG 12-Lead  . EKG 12-Lead    ASSESSMENT AND PLAN:  Active Problems:   Benzodiazepine overdose 1. Generalized weakness, likely due to benzodiazepine overdose, get physical therapist involved for further recommendations. X-rays as well as CT scan findings are unremarkable for traumatic injury 2. Benzodiazepine overdose. The psychiatrist involved, supportive therapy for now 3. Dyspnea on exertion with auscultatory findings concerning for congestive heart failure. The chest x-ray, get echocardiogram 4. Renal insufficiency, likely chronic, follow with therapy. Patient is not on IV fluids due to concerns of congestive heart failure His possible that the renal insufficiency is due to urinary tract infection, get kidney ultrasound 5. Suspected urinary tract infection, acute cystitis. Get urine cultures and initiate patient on antibiotic therapy    Management plans discussed with the patient, family and they are in agreement.   DRUG ALLERGIES:  Allergies  Allergen Reactions  . Food Swelling    [place] food= swelling of tongue and face  . Erythromycin Nausea And Vomiting    CODE STATUS:     Code Status Orders        Start     Ordered   05/14/15 2329  Full code   Continuous     05/14/15 2329      TOTAL TIME TAKING CARE OF THIS PATIENT: 40 minutes.    Katharina Caper M.D on 05/15/2015 at 2:53 PM  Between 7am to 6pm - Pager - [phone]  After 6pm go to [email or web address] - password [email or web address] [facility]  [facility]  Office  [phone]  CC: Primary care physician; Corky Downs, MD

## 2015-05-15 NOTE — Progress Notes (Addendum)
Nutrition Follow-up  DOCUMENTATION CODES:      INTERVENTION:   Meals and Snacks: Cater to patient preferences; discussed smaller, more frequent meals, will send small snacks between meals Medical Food Supplement Therapy: pt agreeable to trying supplement, requesting Glucerna, recommend TID Nutrition related medication management: recommend addition of bowel regimen due to constipation, on pain med regimen  NUTRITION DIAGNOSIS:   Inadequate oral intake related to poor appetite as evidenced by meal completion < 25%, per patient/family report.   GOAL:   Patient will meet greater than or equal to 90% of their needs  MONITOR:    (Energy Intake, Anthropometrics, Digestive System, Electrolyte/Renal profile, Glucose Profile)  REASON FOR ASSESSMENT:   Malnutrition Screening Tool    ASSESSMENT:    Pt admitted with falls s/p benzodiazpene overdose; pt reports no energy, weakness, no appetite. Pt reports she feels dehydrated  Past Medical History  Diagnosis Date  . Diabetes mellitus without complication   . MI, old   . Seizures   . Hypertension   . Stroke     tia  . Anxiety and depression      Diet Order:  Diet heart healthy/carb modified Room service appropriate?: Yes; Fluid consistency:: Thin   Energy Intake: recorded po intake 5% at breakfast; reports poor appetite  Food and nutrition related history: pt reports poor appetite for many months at home; eats maybe 1 meal per day (never eats breakfast, sometimes will eat lunch but usually not, eats big dinner but makes her feel sick on her stomach); pt reports in the past, MD had encouraged her to eat smaller more frequent meals  Digestive System: pt reports some nausea at times, vomitts occasionally; reports consipation/diarrhea at home  Nutrition-Focused physical exam completed. Findings are normal fat depletion, mild muscle depletion, and no edema.   Skin:  Reviewed, no issues  Last BM:  8/9  Height:   Ht Readings  from Last 1 Encounters:  05/15/15  (1.651 m)    Weight: pt reports no real wt loss but notices that her arms and legs have gotten smaller while her abdomen has gotten larger  Wt Readings from Last 1 Encounters:  05/15/15 153 lb 8 oz (69.627 kg)   Wt Readings from Last 10 Encounters:  05/15/15 153 lb 8 oz (69.627 kg)  08/20/14 150 lb 4.8 oz (68.176 kg)    BMI:  Body mass index is 25.54 kg/(m^2).  LOW Care Level   Romelle Starcher MS, [place], LDN [phone] Pager

## 2015-05-15 NOTE — Consult Note (Signed)
[facility] Face-to-Face Psychiatry Consult   Reason for Consult: consult for this 69 year old woman with a history of depression who came into the hospital after overdosing on benzodiazepine's. Referring Physician:  Ether Griffins Patient Identification: Jill Durham MRN:  [id number] Principal Diagnosis: Benzodiazepine overdose Diagnosis:   Patient Active Problem List   Diagnosis Date Noted  . Benzodiazepine overdose [T42.4X1A] 05/14/2015  . Secondary hypertension, unspecified [I15.9] 08/20/2014  . Acute kidney injury [N17.9] 08/20/2014  . UTI (lower urinary tract infection) [N39.0] 08/20/2014  . H/O: CVA (cerebrovascular accident) [Z86.73] 08/20/2014  . History of MI (myocardial infarction) [I25.2] 08/20/2014  . Tobacco abuse [Z72.0] 08/20/2014  . Syncope [R55] 08/18/2014  . Headache [R51] 08/18/2014  . Diabetes type 2, controlled [E11.9] 08/18/2014  . Hypertensive emergency [I10] 08/18/2014    Total Time spent with patient: 1 hour  Subjective:   Jill Durham is a 69 y.o. female patient admitted with "it's my husband's fault".  HPI:  75 year old woman admitted to the hospital after overdose of benzodiazepine's. On admission to the emergency room she told staff that she took 10-16 of the 1 mg Ativan's because she was feeling jittery and wanted to sleep. Today she tells me that she only took 4 pills. She tells me a different story than what she told on admission. She says that she has chronic depression with low mood anxiety and irritability much of the time. Yesterday she lost her temper with her husband. He had done something that made her very angry so she told him that she might as well overdose on her medicine. She claims that he then told her to go ahead if she wanted to. That is when she swallowed the pills and then threw the bottle at him which prompted him to bring her to the hospital. Patient states she had no intention at all of killing herself. States that her mood is chronically  dysphoric but she has no wish to die. Has positive thoughts about her life and plans for the future. Has some chronic poor sleep. Controlled adequately with her current medicine. She thinks her mood has been worse since her antidepressives have been changed. She claims that she had been treated with Elavil for many years and recently it had been changed to nortriptyline which she does not think works as well. past psychiatric history: Long history of depression going back to childhood. Has had psychiatric hospitalization in the past. Has seen psychiatrists in the past. Says that she has never seriously tried to kill herself but has done things to harm herself in the past area no history of psychosis. She thinks Elavil was very helpful for her but says her insurance refused to cover it anymore because of her age which is why she was switched to nortriptyline.recently she has been more irritable and depressed. Appetite has been somewhat more pooor. Social history: Lives with her husband of 20 some years. Both retired. Has adult children from a previous marriage. They appear to be financially in good shape.  Medical history: History of diabetes, history of hypertension, history of CVA and urinary tract infections and myocardial infarction.  Substance abuse history:denies the Use of alcohol or drugs now or in the past.  Family history: Does not know of any family history of mental illness.     HPI Elements:   Quality:  depression. Intentional overdose.. Severity:  moderately severe although evidence would suggest she did not intend to kill her self. Timing:  chronic depression. Impulsive yesterday. Duration:  seems to  be resolving. Context:  chronic marital conflict.  Past Medical History:  Past Medical History  Diagnosis Date  . Diabetes mellitus without complication   . MI, old   . Seizures   . Hypertension   . Stroke     tia  . Anxiety and depression     Past Surgical History  Procedure  Laterality Date  . Abdominal surgery    . Bladder surgery    . Rectal surgery    . Abdominal hysterectomy      partial  . Cardiac surgery      CABG   Family History:  Family History  Problem Relation Age of Onset  . Diabetes Other    Social History:  History  Alcohol Use No     History  Drug Use No    Social History   Social History  . Marital Status: Married    Spouse Name: N/A  . Number of Children: N/A  . Years of Education: N/A   Social History Main Topics  . Smoking status: Current Every Day Smoker -- 1.00 packs/day    Types: Cigarettes  . Smokeless tobacco: None  . Alcohol Use: No  . Drug Use: No  . Sexual Activity: Not Asked   Other Topics Concern  . None   Social History Narrative   Additional Social History:                          Allergies:   Allergies  Allergen Reactions  . Food Swelling    [place] food= swelling of tongue and face  . Erythromycin Nausea And Vomiting    Labs:  Results for orders placed or performed during the hospital encounter of 05/14/15 (from the past 48 hour(s))  Comprehensive metabolic panel     Status: Abnormal   Collection Time: 05/14/15  8:41 PM  Result Value Ref Range   Sodium 140 135 - 145 mmol/L   Potassium 3.7 3.5 - 5.1 mmol/L   Chloride 106 101 - 111 mmol/L   CO2 25 22 - 32 mmol/L   Glucose, Bld 127 (H) 65 - 99 mg/dL   BUN 16 6 - 20 mg/dL   Creatinine, Ser 1.27 (H) 0.44 - 1.00 mg/dL   Calcium 9.3 8.9 - 10.3 mg/dL   Total Protein 7.6 6.5 - 8.1 g/dL   Albumin 3.7 3.5 - 5.0 g/dL   AST 19 15 - 41 U/L   ALT 12 (L) 14 - 54 U/L   Alkaline Phosphatase 140 (H) 38 - 126 U/L   Total Bilirubin 0.4 0.3 - 1.2 mg/dL   GFR calc non Af Amer 41 (L) >60 mL/min   GFR calc Af Amer 48 (L) >60 mL/min    Comment: (NOTE) The eGFR has been calculated using the CKD EPI equation. This calculation has not been validated in all clinical situations. eGFR's persistently <60 mL/min signify possible Chronic  Kidney Disease.    Anion gap 9 5 - 15  CBC     Status: Abnormal   Collection Time: 05/14/15  8:41 PM  Result Value Ref Range   WBC 9.6 3.6 - 11.0 K/uL   RBC 4.66 3.80 - 5.20 MIL/uL   Hemoglobin 13.5 12.0 - 16.0 g/dL   HCT 41.1 35.0 - 47.0 %   MCV 88.1 80.0 - 100.0 fL   MCH 29.1 26.0 - 34.0 pg   MCHC 33.0 32.0 - 36.0 g/dL   RDW 16.0 (H) 11.5 - 14.5 %  Platelets 270 150 - 440 K/uL  Magnesium     Status: Abnormal   Collection Time: 05/14/15  8:41 PM  Result Value Ref Range   Magnesium 1.6 (L) 1.7 - 2.4 mg/dL  Troponin I     Status: None   Collection Time: 05/14/15  8:41 PM  Result Value Ref Range   Troponin I <0.03 <0.031 ng/mL    Comment:        NO INDICATION OF MYOCARDIAL INJURY.   Urine Drug Screen, Qualitative ([facility] only)     Status: Abnormal   Collection Time: 05/14/15 10:15 PM  Result Value Ref Range   Tricyclic, Ur Screen POSITIVE (A) NONE DETECTED   Amphetamines, Ur Screen NONE DETECTED NONE DETECTED   MDMA (Ecstasy)Ur Screen NONE DETECTED NONE DETECTED   Cocaine Metabolite,Ur [place] NONE DETECTED NONE DETECTED   Opiate, Ur Screen NONE DETECTED NONE DETECTED   Phencyclidine (PCP) Ur S NONE DETECTED NONE DETECTED   Cannabinoid 50 Ng, Ur [place] NONE DETECTED NONE DETECTED   Barbiturates, Ur Screen NONE DETECTED NONE DETECTED   Benzodiazepine, Ur Scrn POSITIVE (A) NONE DETECTED   Methadone Scn, Ur NONE DETECTED NONE DETECTED    Comment: (NOTE) 277  Tricyclics, urine               Cutoff 1000 ng/mL 200  Amphetamines, urine             Cutoff 1000 ng/mL 300  MDMA (Ecstasy), urine           Cutoff 500 ng/mL 400  Cocaine Metabolite, urine       Cutoff 300 ng/mL 500  Opiate, urine                   Cutoff 300 ng/mL 600  Phencyclidine (PCP), urine      Cutoff 25 ng/mL 700  Cannabinoid, urine              Cutoff 50 ng/mL 800  Barbiturates, urine             Cutoff 200 ng/mL 900  Benzodiazepine, urine           Cutoff 200 ng/mL 1000 Methadone, urine                Cutoff 300  ng/mL 1100 1200 The urine drug screen provides only a preliminary, unconfirmed 1300 analytical test result and should not be used for non-medical 1400 purposes. Clinical consideration and professional judgment should 1500 be applied to any positive drug screen result due to possible 1600 interfering substances. A more specific alternate chemical method 1700 must be used in order to obtain a confirmed analytical result.  1800 Gas chromato graphy / mass spectrometry (GC/MS) is the preferred 1900 confirmatory method.   Urinalysis complete, with microscopic ([facility] only)     Status: Abnormal   Collection Time: 05/14/15 10:15 PM  Result Value Ref Range   Color, Urine YELLOW (A) YELLOW   APPearance HAZY (A) CLEAR   Glucose, UA NEGATIVE NEGATIVE mg/dL   Bilirubin Urine NEGATIVE NEGATIVE   Ketones, ur TRACE (A) NEGATIVE mg/dL   Specific Gravity, Urine 1.015 1.005 - 1.030   Hgb urine dipstick 1+ (A) NEGATIVE   pH 5.0 5.0 - 8.0   Protein, ur NEGATIVE NEGATIVE mg/dL   Nitrite POSITIVE (A) NEGATIVE   Leukocytes, UA NEGATIVE NEGATIVE   RBC / HPF 0-5 0 - 5 RBC/hpf   WBC, UA 6-30 0 - 5 WBC/hpf   Bacteria, UA RARE (A) NONE  SEEN   Squamous Epithelial / LPF 0-5 (A) NONE SEEN   Mucous PRESENT    Amorphous Crystal PRESENT   Glucose, capillary     Status: Abnormal   Collection Time: 05/15/15  8:24 AM  Result Value Ref Range   Glucose-Capillary 122 (H) 65 - 99 mg/dL  Glucose, capillary     Status: Abnormal   Collection Time: 05/15/15 11:24 AM  Result Value Ref Range   Glucose-Capillary 107 (H) 65 - 99 mg/dL    Vitals: Blood pressure 147/75, pulse 85, temperature 97.4 F (36.3 C), temperature source Oral, resp. rate 21, height 5' 5"  (1.651 m), weight 69.627 kg (153 lb 8 oz), SpO2 91 %.  Risk to Self: Is patient at risk for suicide?: No Risk to Others:   Prior Inpatient Therapy:   Prior Outpatient Therapy:    Current Facility-Administered Medications  Medication Dose Route Frequency Provider  Last Rate Last Dose  . acetaminophen (TYLENOL) tablet 650 mg  650 mg Oral Q6H PRN Lytle Butte, MD       Or  . acetaminophen (TYLENOL) suppository 650 mg  650 mg Rectal Q6H PRN Lytle Butte, MD      . amitriptyline (ELAVIL) tablet 75 mg  75 mg Oral QHS Lytle Butte, MD   75 mg at 05/15/15 0122  . amLODipine (NORVASC) tablet 5 mg  5 mg Oral Daily Lytle Butte, MD   5 mg at 05/15/15 0958  . aspirin EC tablet 81 mg  81 mg Oral BID Lytle Butte, MD   81 mg at 05/15/15 0957  . cefTRIAXone (ROCEPHIN) 1 g in dextrose 5 % 50 mL IVPB  1 g Intravenous Q24H Lytle Butte, MD   0 g at 05/15/15 0121  . feeding supplement (GLUCERNA SHAKE) (GLUCERNA SHAKE) liquid 237 mL  237 mL Oral TID WC Theodoro Grist, MD   237 mL at 05/15/15 1717  . FLUoxetine (PROZAC) capsule 40 mg  40 mg Oral Daily Lytle Butte, MD   40 mg at 05/15/15 0958  . heparin injection 5,000 Units  5,000 Units Subcutaneous 3 times per day Lytle Butte, MD   5,000 Units at 05/15/15 1443  . insulin aspart (novoLOG) injection 0-15 Units  0-15 Units Subcutaneous TID WC Lytle Butte, MD   2 Units at 05/15/15 1746  . insulin aspart (novoLOG) injection 0-5 Units  0-5 Units Subcutaneous QHS Lytle Butte, MD   0 Units at 05/15/15 0121  . multivitamin with minerals tablet 1 tablet  1 tablet Oral Daily Lytle Butte, MD   1 tablet at 05/15/15 0957  . ondansetron (ZOFRAN) tablet 4 mg  4 mg Oral Q6H PRN Lytle Butte, MD       Or  . ondansetron [facility]) injection 4 mg  4 mg Intravenous Q6H PRN Lytle Butte, MD      . oxyCODONE (Oxy IR/ROXICODONE) immediate release tablet 5 mg  5 mg Oral Q4H PRN Lytle Butte, MD   5 mg at 05/15/15 1711  . pantoprazole (PROTONIX) EC tablet 40 mg  40 mg Oral Daily Lytle Butte, MD   40 mg at 05/15/15 0958  . simvastatin (ZOCOR) tablet 20 mg  20 mg Oral QHS Lytle Butte, MD   20 mg at 05/15/15 0122  . sodium chloride 0.9 % injection 3 mL  3 mL Intravenous Q12H Lytle Butte, MD   3 mL at 05/15/15 1159     Musculoskeletal: Strength &  Muscle Tone: within normal limits Gait & Station: unable to stand Patient leans: N/A  Psychiatric Specialty Exam: Physical Exam  Vitals reviewed. Constitutional: She appears well-developed and well-nourished.  HENT:  Head: Normocephalic and atraumatic.  Eyes: Conjunctivae are normal. Pupils are equal, round, and reactive to light.  Neck: Normal range of motion.  Cardiovascular: Normal heart sounds.   Respiratory: Effort normal.  GI: Soft.  Musculoskeletal: Normal range of motion.  Neurological: She is alert.  Skin: Skin is warm and dry.  Psychiatric: Her speech is normal and behavior is normal. Thought content normal. Her affect is blunt. She expresses impulsivity. She exhibits abnormal recent memory.    Review of Systems  Constitutional: Negative.   HENT: Negative.   Eyes: Negative.   Respiratory: Negative.   Cardiovascular: Negative.   Gastrointestinal: Negative.   Musculoskeletal: Negative.   Skin: Negative.   Neurological: Negative.   Psychiatric/Behavioral: Positive for depression and memory loss. Negative for suicidal ideas, hallucinations and substance abuse. The patient is not nervous/anxious and does not have insomnia.     Blood pressure 147/75, pulse 85, temperature 97.4 F (36.3 C), temperature source Oral, resp. rate 21, height 5' 5"  (1.651 m), weight 69.627 kg (153 lb 8 oz), SpO2 91 %.Body mass index is 25.54 kg/(m^2).  General Appearance: Casual  Eye Contact::  Good  Speech:  Clear and Coherent  Volume:  Normal  Mood:  Dysphoric  Affect:  Congruent  Thought Process:  Coherent  Orientation:  Full (Time, Place, and Person)  Thought Content:  Negative  Suicidal Thoughts:  No  Homicidal Thoughts:  No  Memory:  Immediate;   Good Recent;   Fair Remote;   Fair  Judgement:  Fair  Insight:  Fair  Psychomotor Activity:  Decreased  Concentration:  Fair  Recall:  [employer] [place]  Language: Fair  Akathisia:  No   Handed:  Right  AIMS (if indicated):     Assets:  Communication Skills Desire for Improvement Financial Resources/Insurance Housing Social Support  ADL's:  Intact  Cognition: WNL  Sleep:      Medical Decision Making: Review of Psycho-Social Stressors (1), Review or order clinical lab tests (1), Established Problem, Worsening (2), Review or order medicine tests (1) and Review of Medication Regimen & Side Effects (2)  Treatment Plan Summary: Plan this is a 69 year old woman with a long history of chronic depression and 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 She took an intentional overdose but then told her husband about it and cooperated with treatment. She does not sound like she was intentionally trying to kill herself. She is aware that this is unlikely with Ativan alone. Patient seems to have been impulsively acting in a manner to harm herself and escalate the conflict at home. She does not appear to be psychotic. At this point I am choosing not to place her under commitment or refer her to an inpatient psychiatric unit. Because of her current symptoms I think she is unlikely to benefit greatly from inpatient treatment. Patient knows she really should be seeing a psychiatrist instead of relying on her primary care doctor. I have asked social work to give her referrals to local psychiatry. Counseling done with patient to try to improve her self control. I am not going to change her medicine. She was placed back on Elavil here in the hospital. She told me that was not what she was taking so I don't know who is confused and who is right. I  will let Dr. Rebecka Apley work it out for now.  Plan:  Patient does not meet criteria for psychiatric inpatient admission. Supportive therapy provided about ongoing stressors. Discussed crisis plan, support from social network, calling 911, coming to the Emergency Department, and calling Suicide Hotline. Disposition: no change to medicine. I will be  away this weekend and Monday and Tuesday. Please consult psychiatrist on call if needed.  John Clapacs 05/15/2015 7:33 PM

## 2015-05-15 NOTE — Care Management (Signed)
Patient from home with husband.  Admitted after Benzodiazepine overdose.  Patient states that she uses [facility] on [address].  Patients states that she has a walker, wheelchair, and cane in her home.  She states that she normally uses her cane to ambulate.  Patient states that her husband still drives and transports them when necessary.  PT consult pending.  Will follow for additional dc needs

## 2015-05-15 NOTE — Patient Outreach (Signed)
[facility]) Care Management  05/08/2015  NATALE BARBA 23-Oct-1945 [id number]   Notification from Fleeta Emmer, RN to close case due to unable to contact for [facility] Care Management services.  Thanks, Corrie Mckusick. Sharlee Blew [facility] Care Management [facility] CM Assistant Phone: [phone] Fax: [phone]

## 2015-05-15 NOTE — H&P (Signed)
Jill [facility]   PATIENT NAME: Jill [facility]    MR#:  [id number]  DATE OF BIRTH:  1946/03/21   DATE OF ADMISSION:  05/14/2015  PRIMARY CARE PHYSICIAN: Jill Downs, MD   REQUESTING/REFERRING PHYSICIAN: York [facility]  CHIEF COMPLAINT:   Chief Complaint  Patient presents with  . Drug Overdose  . Fall    HISTORY OF PRESENT ILLNESS:  Jill [facility]  is a 69 y.o. female with a known history of essential hypertension, anxiety not otherwise specified presenting after multiple falls. Patient states she is feeling anxious and "jittery" she also wanted to sleep so she took somewhere between 10 and 16 1 mg tablets of Ativan after taking these pills she had a fall with head trauma without loss of consciousness. Due to the fall she presented to [facility] further workup and management.  PAST MEDICAL HISTORY:   Past Medical History  Diagnosis Date  . Diabetes mellitus without complication   . MI, old   . Seizures   . Hypertension   . Stroke     tia  . Anxiety and depression     PAST SURGICAL HISTORY:   Past Surgical History  Procedure Laterality Date  . Abdominal surgery    . Bladder surgery    . Rectal surgery    . Abdominal hysterectomy      partial  . Cardiac surgery      CABG    SOCIAL HISTORY:   Social History  Substance Use Topics  . Smoking status: Current Every Day Smoker -- 1.00 packs/day    Types: Cigarettes  . Smokeless tobacco: Not on file  . Alcohol Use: No    FAMILY HISTORY:   Family History  Problem Relation Age of Onset  . Diabetes Other     DRUG ALLERGIES:   Allergies  Allergen Reactions  . Food Swelling    [place] food= swelling of tongue and face  . Erythromycin Nausea And Vomiting    REVIEW OF SYSTEMS:  REVIEW OF SYSTEMS:  CONSTITUTIONAL: Denies fevers, chills, fatigue,  positive weakness.  EYES: Denies blurred vision, double vision, or eye pain.  EARS, NOSE, THROAT: Denies tinnitus, ear  pain, hearing loss.  RESPIRATORY: denies cough, shortness of breath, wheezing  CARDIOVASCULAR: Denies chest pain, palpitations, edema.  GASTROINTESTINAL: Denies nausea, vomiting, diarrhea, abdominal pain.  GENITOURINARY: Denies dysuria, hematuria.  ENDOCRINE: Denies nocturia or thyroid problems. HEMATOLOGIC AND LYMPHATIC: Denies easy bruising or bleeding.  SKIN: Denies rash or lesions.  MUSCULOSKELETAL: Denies pain in neck, back, shoulder, knees, hips, or further arthritic symptoms.  NEUROLOGIC: Denies paralysis, paresthesias. Positive headache  PSYCHIATRIC: Denies anxiety or depressive symptoms. Otherwise full review of systems performed by me is negative.   MEDICATIONS AT HOME:   Prior to Admission medications   Medication Sig Start Date End Date Taking? Authorizing Provider  ALPRAZolam Prudy Feeler) 0.25 MG tablet Take 0.25 mg by mouth daily as needed for anxiety.    Historical Provider, MD  amitriptyline (ELAVIL) 75 MG tablet Take 75 mg by mouth at bedtime.    Historical Provider, MD  amLODipine (NORVASC) 5 MG tablet Take 5 mg by mouth daily.    Historical Provider, MD  aspirin EC 81 MG tablet Take 81 mg by mouth 2 (two) times daily.    Historical Provider, MD  diphenhydrAMINE (BENADRYL) 25 MG tablet Take 50 mg by mouth daily as needed (for emergency purpose).    Historical Provider, MD  EPIPEN 2-PAK 0.3 MG/0.3ML SOAJ injection  06/18/14   Historical Provider, MD  FLUoxetine (PROZAC) 40 MG capsule Take 40 mg by mouth daily.    Historical Provider, MD  Multiple Vitamins-Minerals (ADULT GUMMY) CHEW Chew 1 tablet by mouth daily.    Historical Provider, MD  omeprazole (PRILOSEC) 40 MG capsule Take 40 mg by mouth 2 (two) times daily.    Historical Provider, MD  simvastatin (ZOCOR) 20 MG tablet Take 20 mg by mouth at bedtime.    Historical Provider, MD      VITAL SIGNS:  Blood pressure 153/71, pulse 83, temperature 98.2 F (36.8 C), temperature source Oral, resp. rate 22, SpO2 95  %.  PHYSICAL EXAMINATION:  VITAL SIGNS: Filed Vitals:   05/14/15 2230  BP: 153/71  Pulse: 83  Temp:   Resp: 22   GENERAL:69 y.o.female currently in no acute distress.  HEAD: Normocephalic, atraumatic.  EYES: Pupils equal, round, reactive to light. Extraocular muscles intact. No scleral icterus.  MOUTH: Moist mucosal membrane. Dentition intact. No abscess noted.  EAR, NOSE, THROAT: Clear without exudates. No external lesions.  NECK: Supple. No thyromegaly. No nodules. No JVD.  PULMONARY: Clear to ascultation, without wheeze rails or rhonci. No use of accessory muscles, Good respiratory effort. good air entry bilaterally CHEST: Nontender to palpation.  CARDIOVASCULAR: S1 and S2. Regular rate and rhythm. No murmurs, rubs, or gallops. No edema. Pedal pulses 2+ bilaterally.  GASTROINTESTINAL: Soft, nontender, nondistended. No masses. Positive bowel sounds. No hepatosplenomegaly.  MUSCULOSKELETAL: No swelling, clubbing, or edema. Range of motion full in all extremities.  NEUROLOGIC: Cranial nerves II through XII are intact. No gross focal neurological deficits. Sensation intact. Reflexes intact.  SKIN: No ulceration, lesions, rashes, or cyanosis. Skin warm and dry. Turgor intact.  PSYCHIATRIC: Mood, affect within normal limits. The patient is awake, alert and oriented x 3. Insight, judgment intact.    LABORATORY PANEL:   CBC  Recent Labs Lab 05/14/15 2041  WBC 9.6  HGB 13.5  HCT 41.1  PLT 270   ------------------------------------------------------------------------------------------------------------------  Chemistries   Recent Labs Lab 05/14/15 2041  NA 140  K 3.7  CL 106  CO2 25  GLUCOSE 127*  BUN 16  CREATININE 1.27*  CALCIUM 9.3  MG 1.6*  AST 19  ALT 12*  ALKPHOS 140*  BILITOT 0.4   ------------------------------------------------------------------------------------------------------------------  Cardiac Enzymes  Recent Labs Lab 05/14/15 2041   TROPONINI <0.03   ------------------------------------------------------------------------------------------------------------------  RADIOLOGY:  Ct Head Wo Contrast  05/14/2015   CLINICAL DATA:  69 year old female with history of drug overdose and fall. Injury to the head.  EXAM: CT HEAD WITHOUT CONTRAST  CT CERVICAL SPINE WITHOUT CONTRAST  TECHNIQUE: Multidetector CT imaging of the head and cervical spine was performed following the standard protocol without intravenous contrast. Multiplanar CT image reconstructions of the cervical spine were also generated.  COMPARISON:  Cervical spine CT scan 01/27/2015.  Head CT 01/27/2015.  FINDINGS: CT HEAD FINDINGS  Mild cerebral atrophy. Patchy and confluent areas of decreased attenuation are noted throughout the deep and periventricular white matter of the cerebral hemispheres bilaterally, compatible with chronic microvascular ischemic disease. Multiple old right basal ganglia lacunar infarcts appear similar to prior examinations. No acute displaced skull fractures are identified. No acute intracranial abnormality. Specifically, no evidence of acute post-traumatic intracranial hemorrhage, no definite regions of acute/subacute cerebral ischemia, no focal mass, mass effect, hydrocephalus or abnormal intra or extra-axial fluid collections. The visualized paranasal sinuses and mastoids are well pneumatized.  CT CERVICAL SPINE FINDINGS  No acute displaced fractures of the cervical  spine. Alignment is anatomic. Prevertebral soft tissues are normal. Multilevel degenerative disc disease, most severe at C3-C4, C4-C5, C5-C6 and C6-C7. Multilevel facet arthropathy. Visualized portions of the upper thorax are unremarkable.  IMPRESSION: 1. No evidence of significant acute traumatic injury to the skull, brain or cervical spine. 2. Mild cerebral atrophy with chronic microvascular ischemic changes in the cerebral white matter and multiple old right basal ganglia lacunar  infarctions. 3. Multilevel degenerative disc disease and cervical spondylosis, as above.   Electronically Signed   By: Trudie Reed M.D.   On: 05/14/2015 21:38   Ct Cervical Spine Wo Contrast  05/14/2015   CLINICAL DATA:  69 year old female with history of drug overdose and fall. Injury to the head.  EXAM: CT HEAD WITHOUT CONTRAST  CT CERVICAL SPINE WITHOUT CONTRAST  TECHNIQUE: Multidetector CT imaging of the head and cervical spine was performed following the standard protocol without intravenous contrast. Multiplanar CT image reconstructions of the cervical spine were also generated.  COMPARISON:  Cervical spine CT scan 01/27/2015.  Head CT 01/27/2015.  FINDINGS: CT HEAD FINDINGS  Mild cerebral atrophy. Patchy and confluent areas of decreased attenuation are noted throughout the deep and periventricular white matter of the cerebral hemispheres bilaterally, compatible with chronic microvascular ischemic disease. Multiple old right basal ganglia lacunar infarcts appear similar to prior examinations. No acute displaced skull fractures are identified. No acute intracranial abnormality. Specifically, no evidence of acute post-traumatic intracranial hemorrhage, no definite regions of acute/subacute cerebral ischemia, no focal mass, mass effect, hydrocephalus or abnormal intra or extra-axial fluid collections. The visualized paranasal sinuses and mastoids are well pneumatized.  CT CERVICAL SPINE FINDINGS  No acute displaced fractures of the cervical spine. Alignment is anatomic. Prevertebral soft tissues are normal. Multilevel degenerative disc disease, most severe at C3-C4, C4-C5, C5-C6 and C6-C7. Multilevel facet arthropathy. Visualized portions of the upper thorax are unremarkable.  IMPRESSION: 1. No evidence of significant acute traumatic injury to the skull, brain or cervical spine. 2. Mild cerebral atrophy with chronic microvascular ischemic changes in the cerebral white matter and multiple old right basal  ganglia lacunar infarctions. 3. Multilevel degenerative disc disease and cervical spondylosis, as above.   Electronically Signed   By: Trudie Reed M.D.   On: 05/14/2015 21:38   Dg Hip Unilat With Pelvis 1v Left  05/14/2015   CLINICAL DATA:  Acute onset of bilateral hip pain. Recent falls. Initial encounter.  EXAM: DG HIP (WITH OR WITHOUT PELVIS) 1V*L*  COMPARISON:  None.  FINDINGS: There is no evidence of fracture or dislocation. Both femoral heads are seated normally within their respective acetabula. The proximal left femur appears intact. Mild degenerative change is noted at the lower lumbar spine. The sacroiliac joints are unremarkable in appearance.  The visualized bowel gas pattern is grossly unremarkable in appearance. The patient is status post tubal ligation.  IMPRESSION: No evidence of fracture or dislocation.   Electronically Signed   By: Roanna Raider M.D.   On: 05/14/2015 21:52   Dg Hip Unilat With Pelvis 1v Right  05/14/2015   CLINICAL DATA:  Acute onset of bilateral hip pain. Recent falls. Initial encounter.  EXAM: DG HIP (WITH OR WITHOUT PELVIS) 1V RIGHT  COMPARISON:  Right hip radiographs performed 04/20/2015  FINDINGS: There is no evidence of fracture or dislocation. Both femoral heads are seated normally within their respective acetabula, on correlation with concurrent pelvic radiograph. The proximal right femur appears intact. The sacroiliac joints are unremarkable in appearance.  The visualized bowel gas pattern  is grossly unremarkable in appearance. The patient is status post tubal ligation.  IMPRESSION: No evidence of fracture or dislocation.   Electronically Signed   By: Roanna Raider M.D.   On: 05/14/2015 21:53    EKG:   Orders placed or performed during the [facility] encounter of 05/14/15  . ED EKG  . ED EKG    IMPRESSION AND PLAN:   69 year old Caucasian female presenting after taking between 10 and 16 mg of Ativan with subsequent falls.  1. Benzodiazepine  overdose: Place on telemetry avoid further sedating agents 2. Hyperlipidemia unspecified: Zocor 3. GERD without esophagitis: PPI therapy 4. Essential hypertension: Norvasc Venous thromboembolism prophylactic: Heparin subcutaneous   All the records are reviewed and case discussed with ED provider. Management plans discussed with the patient, family and they are in agreement.  CODE STATUS: Full  TOTAL TIME TAKING CARE OF THIS PATIENT: 35  minutes.    Glenis Musolf,  Mardi Mainland.D on 05/15/2015 at 12:06 AM  Between 7am to 6pm - Pager - [phone]  After 6pm: House Pager: - [phone]  [facility]  Office  [phone]  CC: Primary care physician; Jill Downs, MD

## 2015-05-16 DIAGNOSIS — S0990XA Unspecified injury of head, initial encounter: Secondary | ICD-10-CM | POA: Diagnosis not present

## 2015-05-16 DIAGNOSIS — E119 Type 2 diabetes mellitus without complications: Secondary | ICD-10-CM

## 2015-05-16 DIAGNOSIS — F603 Borderline personality disorder: Secondary | ICD-10-CM

## 2015-05-16 DIAGNOSIS — N289 Disorder of kidney and ureter, unspecified: Secondary | ICD-10-CM | POA: Diagnosis not present

## 2015-05-16 DIAGNOSIS — M542 Cervicalgia: Secondary | ICD-10-CM | POA: Diagnosis not present

## 2015-05-16 DIAGNOSIS — M25552 Pain in left hip: Secondary | ICD-10-CM | POA: Diagnosis not present

## 2015-05-16 DIAGNOSIS — R0600 Dyspnea, unspecified: Secondary | ICD-10-CM

## 2015-05-16 DIAGNOSIS — F329 Major depressive disorder, single episode, unspecified: Secondary | ICD-10-CM

## 2015-05-16 DIAGNOSIS — F32A Depression, unspecified: Secondary | ICD-10-CM

## 2015-05-16 DIAGNOSIS — R55 Syncope and collapse: Secondary | ICD-10-CM | POA: Diagnosis not present

## 2015-05-16 DIAGNOSIS — R531 Weakness: Secondary | ICD-10-CM | POA: Diagnosis not present

## 2015-05-16 DIAGNOSIS — R42 Dizziness and giddiness: Secondary | ICD-10-CM | POA: Diagnosis not present

## 2015-05-16 DIAGNOSIS — R0609 Other forms of dyspnea: Secondary | ICD-10-CM | POA: Diagnosis not present

## 2015-05-16 DIAGNOSIS — T424X1A Poisoning by benzodiazepines, accidental (unintentional), initial encounter: Secondary | ICD-10-CM | POA: Diagnosis not present

## 2015-05-16 DIAGNOSIS — N3000 Acute cystitis without hematuria: Secondary | ICD-10-CM

## 2015-05-16 DIAGNOSIS — M25551 Pain in right hip: Secondary | ICD-10-CM | POA: Diagnosis not present

## 2015-05-16 DIAGNOSIS — R51 Headache: Secondary | ICD-10-CM | POA: Diagnosis not present

## 2015-05-16 LAB — BASIC METABOLIC PANEL
Anion gap: 12 (ref 5–15)
BUN: 18 mg/dL (ref 6–20)
CO2: 28 mmol/L (ref 22–32)
Calcium: 9 mg/dL (ref 8.9–10.3)
Chloride: 100 mmol/L — ABNORMAL LOW (ref 101–111)
Creatinine, Ser: 1.43 mg/dL — ABNORMAL HIGH (ref 0.44–1.00)
GFR calc non Af Amer: 36 mL/min — ABNORMAL LOW (ref >60)
GFR, EST AFRICAN AMERICAN: 42 mL/min — AB (ref >60)
GLUCOSE: 116 mg/dL — AB (ref 65–99)
POTASSIUM: 3.2 mmol/L — AB (ref 3.5–5.1)
SODIUM: 140 mmol/L (ref 135–145)

## 2015-05-16 LAB — GLUCOSE, CAPILLARY
GLUCOSE-CAPILLARY: 168 mg/dL — AB (ref 65–99)
Glucose-Capillary: 131 mg/dL — ABNORMAL HIGH (ref 65–99)

## 2015-05-16 LAB — HEMOGLOBIN A1C: Hgb A1c MFr Bld: 7.3 % — ABNORMAL HIGH (ref 4.0–6.0)

## 2015-05-16 MED ORDER — CEPHALEXIN 500 MG PO CAPS: 500.0000 mg | ORAL_CAPSULE | Freq: Four times a day (QID) | ORAL | Status: DC

## 2015-05-16 MED ORDER — OXYCODONE HCL 5 MG PO TABS: 2.5000 mg | ORAL_TABLET | Freq: Four times a day (QID) | ORAL | Status: DC | PRN

## 2015-05-16 MED ORDER — GLUCERNA SHAKE PO LIQD: 237.0000 mL | Freq: Three times a day (TID) | ORAL | Status: AC

## 2015-05-16 NOTE — Progress Notes (Signed)
Physical Therapy Evaluation Patient Details Name: Jill [facility] MRN: [id number] DOB: [birth year] Today's Date: 05/16/2015   History of Present Illness  Patient is a 69 y.o. female admitted for overdose on benzodiazepines. Patient has hx of depression, explosive personality disorder, renal insufficiency, HTN, and diabetes.  Clinical Impression  Patient presents to hospital after overdose on benzodiazepines. Demonstrates decreased short term memory recall of events. Notes that previously she was ambulating in mobile home without using assistive devices available at home. Patient demonstrated decreased dynamic balance in standing, requiring RW. Because patient was previously independent in ambulating, she will benefit from continued PT services and further f/u to increase strength, balance, and mobility.    Follow Up Recommendations Home health PT    Equipment Recommendations  None recommended by PT    Recommendations for Other Services       Precautions / Restrictions Precautions Precautions: Fall Restrictions Weight Bearing Restrictions: No      Mobility  Bed Mobility Overal bed mobility: Independent                Transfers Overall transfer level: Modified independent Equipment used: Rolling walker (2 wheeled)             General transfer comment: Patient demonstrated slight unsteadiness when moving from sit to stand without AD. Improved balance with RW.  Ambulation/Gait Ambulation/Gait assistance: Modified independent (Device/Increase time) Ambulation Distance (Feet): 70 Feet Assistive device: Rolling walker (2 wheeled) Gait Pattern/deviations: Decreased stride length     General Gait Details: Patient ambulates at decreased cadence, taking short steps. Verbal cues used to improve safety awareness with RW.  Stairs            Wheelchair Mobility    Modified Rankin (Stroke Patients Only)       Balance Overall balance assessment: Modified  Independent                                           Pertinent Vitals/Pain Pain Assessment: No/denies pain    Home Living Family/patient expects to be discharged to:: Private residence Living Arrangements: Spouse/significant other Available Help at Discharge: Family;Friend(s);Available 24 hours/day;Available PRN/intermittently Type of Home: Mobile home Home Access: Stairs to enter Entrance Stairs-Rails: Left Entrance Stairs-Number of Steps: 1 Home Layout: One level Home Equipment: Walker - 2 wheels;Cane - single point      Prior Function Level of Independence: Needs assistance   Gait / Transfers Assistance Needed: Patient reportedly hasn't been using ADs around home but has them available.  ADL's / Homemaking Assistance Needed: Patient's husband assists with bathing/cooking/shopping        Hand Dominance        Extremity/Trunk Assessment   Upper Extremity Assessment: Overall WFL for tasks assessed           Lower Extremity Assessment: Overall WFL for tasks assessed      Cervical / Trunk Assessment: Normal  Communication   Communication: No difficulties  Cognition Arousal/Alertness: Awake/alert Behavior During Therapy: WFL for tasks assessed/performed Overall Cognitive Status: Within Functional Limits for tasks assessed       Memory: Decreased short-term memory              General Comments      Exercises        Assessment/Plan    PT Assessment Patient needs continued PT services  PT Diagnosis Generalized weakness;Difficulty walking   PT  Problem List Decreased strength;Decreased activity tolerance;Decreased knowledge of use of DME;Decreased safety awareness;Decreased mobility  PT Treatment Interventions DME instruction;Gait training;Functional mobility training;Therapeutic activities;Therapeutic exercise;Patient/family education   PT Goals (Current goals can be found in the Care Plan section) Acute Rehab PT Goals Patient  Stated Goal: "I want to go home." PT Goal Formulation: With patient/family Time For Goal Achievement: 05/30/15 Potential to Achieve Goals: Good    Frequency Min 2X/week   Barriers to discharge        Co-evaluation               End of Session Equipment Utilized During Treatment: Gait belt Activity Tolerance: Patient tolerated treatment well Patient left: in bed;with call bell/phone within reach;with bed alarm set;with nursing/sitter in room;with family/visitor present Nurse Communication: Mobility status    Functional Limitation: Mobility: Walking and moving around Mobility: Walking and Moving Around Current Status [phone]): At least 20 percent but less than 40 percent impaired, limited or restricted Mobility: Walking and Moving Around Goal Status [phone]): At least 1 percent but less than 20 percent impaired, limited or restricted    Time: 1245-1305 PT Time Calculation (min) (ACUTE ONLY): 20 min   Charges:   PT Evaluation $Initial PT Evaluation Tier I: 1 Procedure     PT G Codes:   PT G-Codes **NOT FOR INPATIENT CLASS** Functional Limitation: Mobility: Walking and moving around Mobility: Walking and Moving Around Current Status ([id number]): At least 20 percent but less than 40 percent impaired, limited or restricted Mobility: Walking and Moving Around Goal Status [phone]): At least 1 percent but less than 20 percent impaired, limited or restricted    Neita Carp, PT, DPT 05/16/2015, 2:02 PM

## 2015-05-16 NOTE — Progress Notes (Signed)
Pt to be discharged to home today. Iv and tele removed. Discharge instructions and prescrips given to pt and spouse to their understanding. disch via w.c.

## 2015-05-16 NOTE — Care Management Note (Signed)
Case Management Note  Patient Details  Name: HEIDEE AUDI MRN: [id number] Date of Birth: Jan 29, 1946  Subjective/Objective:       Discussed home health planning with Dr Winona Legato  who stated to this writer that she was not going to order home health services after Ms Metheny stated that she would be at [facility] in a camper for most of the summer. Dr Winona Legato does want Ms Redel to have a referral to a local mental health provider. Discussed with Wilford Grist, CSW, who will provide Ms Joffe with referral/contact information to [facility].            Action/Plan:   Expected Discharge Date:                  Expected Discharge Plan:     In-House Referral:     Discharge planning Services     Post Acute Care Choice:    Choice offered to:     DME Arranged:    DME Agency:     HH Arranged:    HH Agency:     Status of Service:     Medicare Important Message Given:    Date Medicare IM Given:    Medicare IM give by:    Date Additional Medicare IM Given:    Additional Medicare Important Message give by:     If discussed at Long Length of Stay Meetings, dates discussed:    Additional Comments:  Isacc Turney A, RN 05/16/2015, 2:36 PM

## 2015-05-16 NOTE — Discharge Summary (Signed)
[facility]   PATIENT NAME: Jill Durham    MR#:  [id number]  DATE OF BIRTH:  04-30-1946  DATE OF ADMISSION:  05/14/2015 ADMITTING PHYSICIAN: Wyatt Haste, MD  DATE OF DISCHARGE: No discharge date for patient encounter.  PRIMARY CARE PHYSICIAN: MASOUD, JAVED, MD     ADMISSION DIAGNOSIS:  Muscle strain [T14.8] UTI (urinary tract infection), uncomplicated [N39.0] Benzodiazepine (tranquilizer) overdose, accidental or unintentional, initial encounter [T42.4X1A] Syncope, unspecified syncope type [R55] Acute nonintractable headache, unspecified headache type [R51]  DISCHARGE DIAGNOSIS:  Principal Problem:   Benzodiazepine overdose Active Problems:   Dyspnea   Renal insufficiency   Acute cystitis without hematuria   Diabetes   Chronic depression   Explosive personality disorder   Generalized weakness   SECONDARY DIAGNOSIS:   Past Medical History  Diagnosis Date  . Diabetes mellitus without complication   . MI, old   . Seizures   . Hypertension   . Stroke     tia  . Anxiety and depression     .pro HOSPITAL COURSE:   Jill Durham is 69 year old Caucasian female with past medical history significant for history of depression who presents to the hospital after taking numerous doses of Ativan. She reported she felt agitated. He wanted to sleep so she took 10-15, 1 mg tablets of Ativan, after which she became atactic dizzy and the fell down. On arrival to emergency room, patient's CT of the head as well as neck were unremarkable as well as her pelvic x-ray. Her labs revealed renal insufficiency, magnesium level was low. Patient was admitted to the hospital for further evaluation and consultation with Dr. Toni Amend,  Psychiatrist was obtained. Psychiatrist felt that patient had very likely has some degree of personality disorder. He felt that it was intentional overdose, but did not sound that she was intentionally trying to kill  herself. He thought that it was impulsive action to escalate conflict at home, however, he did not feel that patient needs to be placed under commitment or treated in inpatient psychiatric unit. He recommended to initiate patient on Elavil and follow-up with psychiatrist as outpatient. Dr. Toni Amend *social worker to give her referral to local psychiatrists. While in the hospital patient had urinalysis done which revealed pyuria, concerning for infection. Urine cultures were taken and they are growing more than 100,000 colony-forming units of gram-negative rod. Patient was treated with Rocephin while she was in the hospital and Keflex was recommended on discharge to finish course.  Discussion by problem 1. Generalized weakness, likely due to benzodiazepine overdose, improved,  physical therapist recommended to continue physical therapy services. However, patient is not sure about that. We'll attempt to order home health physical therapy as well as a Recruitment consultant.  X-rays as well as CT scan findings are unremarkable for traumatic injury 2. Benzodiazepine overdose. Appreciate psychiatrist input, psychiatry follow-up was recommended. We will be arranging for patient. Sick arch of nurse at home. Follow-up with psychiatrist as outpatient would be recommended, supportive therapy provided to patient today.  3. Dyspnea on exertion with auscultatory findings concerning for congestive heart failure, resolved on Lasix. Echocardiogram was not performed yet can be done as outpatient.  4. Renal insufficiency, likely chronic, stable with Lasix therapy. Unremarkable kidney ultrasound 5. Acute cystitis without hematuria due to gram-negative rods of unclear ID at present. Pending urine cultures and continue patient on Keflex therapy to complete course DISCHARGE CONDITIONS:   Fair  CONSULTS OBTAINED:  Treatment Team:  Jackquline Denmark  Clapacs, MD  DRUG ALLERGIES:   Allergies  Allergen Reactions  . Food Swelling    [place] food=  swelling of tongue and face  . Erythromycin Nausea And Vomiting    DISCHARGE MEDICATIONS:   Current Discharge Medication List    START taking these medications   Details  cephALEXin (KEFLEX) 500 MG capsule Take 1 capsule (500 mg total) by mouth 4 (four) times daily. Qty: 14 capsule, Refills: 0    feeding supplement, GLUCERNA SHAKE, (GLUCERNA SHAKE) LIQD Take 237 mLs by mouth 3 (three) times daily with meals. Qty: 40 Can, Refills: 3      CONTINUE these medications which have NOT CHANGED   Details  ALPRAZolam (XANAX) 0.25 MG tablet Take 0.25 mg by mouth daily as needed for anxiety.    amitriptyline (ELAVIL) 75 MG tablet Take 75 mg by mouth at bedtime.    amLODipine (NORVASC) 5 MG tablet Take 5 mg by mouth daily.    aspirin EC 81 MG tablet Take 81 mg by mouth 2 (two) times daily.    diphenhydrAMINE (BENADRYL) 25 MG tablet Take 50 mg by mouth daily as needed (for emergency purpose).    EPIPEN 2-PAK 0.3 MG/0.3ML SOAJ injection     FLUoxetine (PROZAC) 40 MG capsule Take 40 mg by mouth daily.    Multiple Vitamins-Minerals (ADULT GUMMY) CHEW Chew 1 tablet by mouth daily.    omeprazole (PRILOSEC) 40 MG capsule Take 40 mg by mouth 2 (two) times daily.    simvastatin (ZOCOR) 20 MG tablet Take 20 mg by mouth at bedtime.         DISCHARGE INSTRUCTIONS:    Patient is to follow-up with psychiatrist as outpatient and primary care physician, Dr. Harl Bowie.   If you experience worsening of your admission symptoms, develop shortness of breath, life threatening emergency, suicidal or homicidal thoughts you must seek medical attention immediately by calling 911 or calling your MD immediately  if symptoms less severe.  You Must read complete instructions/literature along with all the possible adverse reactions/side effects for all the Medicines you take and that have been prescribed to you. Take any new Medicines after you have completely understood and accept all the possible adverse  reactions/side effects.   Please note  You were cared for by a hospitalist during your hospital stay. If you have any questions about your discharge medications or the care you received while you were in the hospital after you are discharged, you can call the unit and asked to speak with the hospitalist on call if the hospitalist that took care of you is not available. Once you are discharged, your primary care physician will handle any further medical issues. Please note that NO REFILLS for any discharge medications will be authorized once you are discharged, as it is imperative that you return to your primary care physician (or establish a relationship with a primary care physician if you do not have one) for your aftercare needs so that they can reassess your need for medications and monitor your lab values.    Today   CHIEF COMPLAINT:   Chief Complaint  Patient presents with  . Drug Overdose  . Fall    HISTORY OF PRESENT ILLNESS:  Jill Durham  is a 69 y.o. female with a known history of depression who presents to the hospital after taking numerous doses of Ativan. She reported she felt agitated. He wanted to sleep so she took 10-15, 1 mg tablets of Ativan, after which she became atactic dizzy  and the fell down. On arrival to emergency room, patient's CT of the head as well as neck were unremarkable as well as her pelvic x-ray. Her labs revealed renal insufficiency, magnesium level was low. Patient was admitted to the hospital for further evaluation and consultation with Dr. Toni Amend,  Psychiatrist was obtained. Psychiatrist felt that patient had very likely has some degree of personality disorder. He felt that it was intentional overdose, but did not sound that she was intentionally trying to kill herself. He thought that it was impulsive action to escalate conflict at home, however, he did not feel that patient needs to be placed under commitment or treated in inpatient psychiatric unit. He  recommended to initiate patient on Elavil and follow-up with psychiatrist as outpatient. Dr. Toni Amend *social worker to give her referral to local psychiatrists. While in the hospital patient had urinalysis done which revealed pyuria, concerning for infection. Urine cultures were taken and they are growing more than 100,000 colony-forming units of gram-negative rod. Patient was treated with Rocephin while she was in the hospital and Keflex was recommended on discharge to finish course.  Discussion by problem 1. Generalized weakness, likely due to benzodiazepine overdose, improved,  physical therapist recommended to continue physical therapy services. However, patient is not sure about that. We'll attempt to order home health physical therapy as well as a Recruitment consultant.  X-rays as well as CT scan findings are unremarkable for traumatic injury 2. Benzodiazepine overdose. Appreciate psychiatrist input, psychiatry follow-up was recommended. We will be arranging for patient. Sick arch of nurse at home. Follow-up with psychiatrist as outpatient would be recommended, supportive therapy provided to patient today.  3. Dyspnea on exertion with auscultatory findings concerning for congestive heart failure, resolved on Lasix. Echocardiogram was not performed yet can be done as outpatient.  4. Renal insufficiency, likely chronic, stable with Lasix therapy. Unremarkable kidney ultrasound 5. Acute cystitis without hematuria due to gram-negative rods of unclear ID at present. Pending urine cultures and continue patient on Keflex therapy to complete course    VITAL SIGNS:  Blood pressure 135/68, pulse 93, temperature 97.7 F (36.5 C), temperature source Oral, resp. rate 20, height 5\' 5"  (1.651 m), weight 69.627 kg (153 lb 8 oz), SpO2 93 %.  I/O:   Intake/Output Summary (Last 24 hours) at 05/16/15 1420 Last data filed at 05/16/15 1130  Gross per 24 hour  Intake    480 ml  Output    400 ml  Net     80 ml     PHYSICAL EXAMINATION:  GENERAL:  69 y.o.-year-old patient lying in the bed with no acute distress.  EYES: Pupils equal, round, reactive to light and accommodation. No scleral icterus. Extraocular muscles intact.  HEENT: Head atraumatic, normocephalic. Oropharynx and nasopharynx clear.  NECK:  Supple, no jugular venous distention. No thyroid enlargement, no tenderness.  LUNGS: Normal breath sounds bilaterally, no wheezing, rales,rhonchi or crepitation. No use of accessory muscles of respiration.  CARDIOVASCULAR: S1, S2 normal. No murmurs, rubs, or gallops.  ABDOMEN: Soft, non-tender, non-distended. Bowel sounds present. No organomegaly or mass.  EXTREMITIES: No pedal edema, cyanosis, or clubbing.  NEUROLOGIC: Cranial nerves II through XII are intact. Muscle strength 5/5 in all extremities. Sensation intact. Gait not checked.  PSYCHIATRIC: The patient is alert and oriented x 3.  SKIN: No obvious rash, lesion, or ulcer.   DATA REVIEW:   CBC  Recent Labs Lab 05/14/15 2041  WBC 9.6  HGB 13.5  HCT 41.1  PLT 270  Chemistries   Recent Labs Lab 05/14/15 2041 05/16/15 0449  NA 140 140  K 3.7 3.2*  CL 106 100*  CO2 25 28  GLUCOSE 127* 116*  BUN 16 18  CREATININE 1.27* 1.43*  CALCIUM 9.3 9.0  MG 1.6*  --   AST 19  --   ALT 12*  --   ALKPHOS 140*  --   BILITOT 0.4  --     Cardiac Enzymes  Recent Labs Lab 05/14/15 2041  TROPONINI <0.03    Microbiology Results  Results for orders placed or performed during the hospital encounter of 05/14/15  Urine culture     Status: None (Preliminary result)   Collection Time: 05/14/15 10:15 PM  Result Value Ref Range Status   Specimen Description URINE, RANDOM  Final   Special Requests Normal  Final   Culture   Final    >=100,000 COLONIES/mL GRAM NEGATIVE RODS IDENTIFICATION AND SUSCEPTIBILITIES TO FOLLOW    Report Status PENDING  Incomplete    RADIOLOGY:  Ct Head Wo Contrast  05/14/2015   CLINICAL DATA:  69 year old  female with history of drug overdose and fall. Injury to the head.  EXAM: CT HEAD WITHOUT CONTRAST  CT CERVICAL SPINE WITHOUT CONTRAST  TECHNIQUE: Multidetector CT imaging of the head and cervical spine was performed following the standard protocol without intravenous contrast. Multiplanar CT image reconstructions of the cervical spine were also generated.  COMPARISON:  Cervical spine CT scan 01/27/2015.  Head CT 01/27/2015.  FINDINGS: CT HEAD FINDINGS  Mild cerebral atrophy. Patchy and confluent areas of decreased attenuation are noted throughout the deep and periventricular white matter of the cerebral hemispheres bilaterally, compatible with chronic microvascular ischemic disease. Multiple old right basal ganglia lacunar infarcts appear similar to prior examinations. No acute displaced skull fractures are identified. No acute intracranial abnormality. Specifically, no evidence of acute post-traumatic intracranial hemorrhage, no definite regions of acute/subacute cerebral ischemia, no focal mass, mass effect, hydrocephalus or abnormal intra or extra-axial fluid collections. The visualized paranasal sinuses and mastoids are well pneumatized.  CT CERVICAL SPINE FINDINGS  No acute displaced fractures of the cervical spine. Alignment is anatomic. Prevertebral soft tissues are normal. Multilevel degenerative disc disease, most severe at C3-C4, C4-C5, C5-C6 and C6-C7. Multilevel facet arthropathy. Visualized portions of the upper thorax are unremarkable.  IMPRESSION: 1. No evidence of significant acute traumatic injury to the skull, brain or cervical spine. 2. Mild cerebral atrophy with chronic microvascular ischemic changes in the cerebral white matter and multiple old right basal ganglia lacunar infarctions. 3. Multilevel degenerative disc disease and cervical spondylosis, as above.   Electronically Signed   By: Trudie Reed M.D.   On: 05/14/2015 21:38   Ct Cervical Spine Wo Contrast  05/14/2015   CLINICAL  DATA:  69 year old female with history of drug overdose and fall. Injury to the head.  EXAM: CT HEAD WITHOUT CONTRAST  CT CERVICAL SPINE WITHOUT CONTRAST  TECHNIQUE: Multidetector CT imaging of the head and cervical spine was performed following the standard protocol without intravenous contrast. Multiplanar CT image reconstructions of the cervical spine were also generated.  COMPARISON:  Cervical spine CT scan 01/27/2015.  Head CT 01/27/2015.  FINDINGS: CT HEAD FINDINGS  Mild cerebral atrophy. Patchy and confluent areas of decreased attenuation are noted throughout the deep and periventricular white matter of the cerebral hemispheres bilaterally, compatible with chronic microvascular ischemic disease. Multiple old right basal ganglia lacunar infarcts appear similar to prior examinations. No acute displaced skull fractures are identified. No  acute intracranial abnormality. Specifically, no evidence of acute post-traumatic intracranial hemorrhage, no definite regions of acute/subacute cerebral ischemia, no focal mass, mass effect, hydrocephalus or abnormal intra or extra-axial fluid collections. The visualized paranasal sinuses and mastoids are well pneumatized.  CT CERVICAL SPINE FINDINGS  No acute displaced fractures of the cervical spine. Alignment is anatomic. Prevertebral soft tissues are normal. Multilevel degenerative disc disease, most severe at C3-C4, C4-C5, C5-C6 and C6-C7. Multilevel facet arthropathy. Visualized portions of the upper thorax are unremarkable.  IMPRESSION: 1. No evidence of significant acute traumatic injury to the skull, brain or cervical spine. 2. Mild cerebral atrophy with chronic microvascular ischemic changes in the cerebral white matter and multiple old right basal ganglia lacunar infarctions. 3. Multilevel degenerative disc disease and cervical spondylosis, as above.   Electronically Signed   By: Trudie Reed M.D.   On: 05/14/2015 21:38   US Renal  05/15/2015   CLINICAL DATA:   Renal insufficiency.  EXAM: RENAL / URINARY TRACT ULTRASOUND COMPLETE  COMPARISON:  CT scan 11/20/2014  FINDINGS: Right Kidney:  Length: 8.9 cm. Mild renal cortical thinning but normal echogenicity. No focal lesions, renal calculi or hydronephrosis.  Left Kidney:  Length: 10.1 cm. Mild renal cortical thinning but normal echogenicity. No focal lesions, hydronephrosis or renal calculi.  Bladder:  Appears normal for degree of bladder distention.  IMPRESSION: Mild renal cortical thinning but normal renal echogenicity and no hydronephrosis.   Electronically Signed   By: Rudie Meyer M.D.   On: 05/15/2015 16:53   Dg Hip Unilat With Pelvis 1v Left  05/14/2015   CLINICAL DATA:  Acute onset of bilateral hip pain. Recent falls. Initial encounter.  EXAM: DG HIP (WITH OR WITHOUT PELVIS) 1V*L*  COMPARISON:  None.  FINDINGS: There is no evidence of fracture or dislocation. Both femoral heads are seated normally within their respective acetabula. The proximal left femur appears intact. Mild degenerative change is noted at the lower lumbar spine. The sacroiliac joints are unremarkable in appearance.  The visualized bowel gas pattern is grossly unremarkable in appearance. The patient is status post tubal ligation.  IMPRESSION: No evidence of fracture or dislocation.   Electronically Signed   By: Roanna Raider M.D.   On: 05/14/2015 21:52   Dg Hip Unilat With Pelvis 1v Right  05/14/2015   CLINICAL DATA:  Acute onset of bilateral hip pain. Recent falls. Initial encounter.  EXAM: DG HIP (WITH OR WITHOUT PELVIS) 1V RIGHT  COMPARISON:  Right hip radiographs performed 04/20/2015  FINDINGS: There is no evidence of fracture or dislocation. Both femoral heads are seated normally within their respective acetabula, on correlation with concurrent pelvic radiograph. The proximal right femur appears intact. The sacroiliac joints are unremarkable in appearance.  The visualized bowel gas pattern is grossly unremarkable in appearance. The  patient is status post tubal ligation.  IMPRESSION: No evidence of fracture or dislocation.   Electronically Signed   By: Roanna Raider M.D.   On: 05/14/2015 21:53    EKG:   Orders placed or performed during the hospital encounter of 05/14/15  . ED EKG  . ED EKG  . EKG 12-Lead  . EKG 12-Lead      Management plans discussed with the patient, family and they are in agreement.  CODE STATUS:     Code Status Orders        Start     Ordered   05/14/15 2329  Full code   Continuous     05/14/15 2329  TOTAL TIME TAKING CARE OF THIS PATIENT: 40 minutes.    Katharina Caper M.D on 05/16/2015 at 2:20 PM  Between 7am to 6pm - Pager - [phone]  After 6pm go to [email or web address] - password [email or web address] [facility]  [facility]  Office  [phone]  CC: Primary care physician; Corky Downs, MD

## 2015-05-20 LAB — URINE CULTURE
Culture: >=100000
Special Requests: NORMAL

## 2015-05-21 DIAGNOSIS — G459 Transient cerebral ischemic attack, unspecified: Secondary | ICD-10-CM | POA: Diagnosis not present

## 2015-05-21 DIAGNOSIS — E119 Type 2 diabetes mellitus without complications: Secondary | ICD-10-CM | POA: Diagnosis not present

## 2015-05-21 DIAGNOSIS — I2510 Atherosclerotic heart disease of native coronary artery without angina pectoris: Secondary | ICD-10-CM | POA: Diagnosis not present

## 2015-05-21 DIAGNOSIS — I119 Hypertensive heart disease without heart failure: Secondary | ICD-10-CM | POA: Diagnosis not present

## 2015-06-23 DIAGNOSIS — E119 Type 2 diabetes mellitus without complications: Secondary | ICD-10-CM | POA: Diagnosis not present

## 2015-06-23 DIAGNOSIS — I429 Cardiomyopathy, unspecified: Secondary | ICD-10-CM | POA: Diagnosis not present

## 2015-06-23 DIAGNOSIS — I119 Hypertensive heart disease without heart failure: Secondary | ICD-10-CM | POA: Diagnosis not present

## 2015-06-23 DIAGNOSIS — G459 Transient cerebral ischemic attack, unspecified: Secondary | ICD-10-CM | POA: Diagnosis not present

## 2015-06-25 DIAGNOSIS — Z23 Encounter for immunization: Secondary | ICD-10-CM | POA: Diagnosis not present

## 2015-07-21 DIAGNOSIS — I119 Hypertensive heart disease without heart failure: Secondary | ICD-10-CM | POA: Diagnosis not present

## 2015-07-21 DIAGNOSIS — I2510 Atherosclerotic heart disease of native coronary artery without angina pectoris: Secondary | ICD-10-CM | POA: Diagnosis not present

## 2015-07-21 DIAGNOSIS — I429 Cardiomyopathy, unspecified: Secondary | ICD-10-CM | POA: Diagnosis not present

## 2015-07-21 DIAGNOSIS — G459 Transient cerebral ischemic attack, unspecified: Secondary | ICD-10-CM | POA: Diagnosis not present

## 2015-08-14 DIAGNOSIS — R260 Ataxic gait: Secondary | ICD-10-CM | POA: Diagnosis not present

## 2015-08-14 DIAGNOSIS — R2681 Unsteadiness on feet: Secondary | ICD-10-CM | POA: Diagnosis not present

## 2015-08-14 DIAGNOSIS — M6281 Muscle weakness (generalized): Secondary | ICD-10-CM | POA: Diagnosis not present

## 2015-08-14 DIAGNOSIS — I69993 Ataxia following unspecified cerebrovascular disease: Secondary | ICD-10-CM | POA: Diagnosis not present

## 2015-08-14 DIAGNOSIS — I2510 Atherosclerotic heart disease of native coronary artery without angina pectoris: Secondary | ICD-10-CM | POA: Diagnosis not present

## 2015-08-14 DIAGNOSIS — R29898 Other symptoms and signs involving the musculoskeletal system: Secondary | ICD-10-CM | POA: Diagnosis not present

## 2015-08-16 ENCOUNTER — Emergency Department: Payer: Commercial Managed Care - HMO

## 2015-08-16 ENCOUNTER — Encounter: Payer: Self-pay | Admitting: Emergency Medicine

## 2015-08-16 ENCOUNTER — Emergency Department
Admission: EM | Admit: 2015-08-16 | Discharge: 2015-08-16 | Disposition: A | Discharge status: Home / Self Care | Payer: Commercial Managed Care - HMO | Attending: Emergency Medicine | Admitting: Emergency Medicine

## 2015-08-16 DIAGNOSIS — Z7982 Long term (current) use of aspirin: Secondary | ICD-10-CM | POA: Insufficient documentation

## 2015-08-16 DIAGNOSIS — F17210 Nicotine dependence, cigarettes, uncomplicated: Secondary | ICD-10-CM | POA: Insufficient documentation

## 2015-08-16 DIAGNOSIS — R55 Syncope and collapse: Secondary | ICD-10-CM | POA: Insufficient documentation

## 2015-08-16 DIAGNOSIS — S299XXA Unspecified injury of thorax, initial encounter: Secondary | ICD-10-CM | POA: Diagnosis not present

## 2015-08-16 DIAGNOSIS — W19XXXA Unspecified fall, initial encounter: Secondary | ICD-10-CM | POA: Diagnosis not present

## 2015-08-16 DIAGNOSIS — I10 Essential (primary) hypertension: Secondary | ICD-10-CM | POA: Insufficient documentation

## 2015-08-16 DIAGNOSIS — S0990XA Unspecified injury of head, initial encounter: Secondary | ICD-10-CM | POA: Diagnosis not present

## 2015-08-16 DIAGNOSIS — Z79899 Other long term (current) drug therapy: Secondary | ICD-10-CM | POA: Diagnosis not present

## 2015-08-16 DIAGNOSIS — E119 Type 2 diabetes mellitus without complications: Secondary | ICD-10-CM | POA: Insufficient documentation

## 2015-08-16 DIAGNOSIS — R300 Dysuria: Secondary | ICD-10-CM | POA: Insufficient documentation

## 2015-08-16 DIAGNOSIS — M6281 Muscle weakness (generalized): Secondary | ICD-10-CM | POA: Diagnosis not present

## 2015-08-16 DIAGNOSIS — R531 Weakness: Secondary | ICD-10-CM | POA: Diagnosis present

## 2015-08-16 LAB — URINALYSIS COMPLETE WITH MICROSCOPIC (ARMC ONLY)
Bilirubin Urine: NEGATIVE
GLUCOSE, UA: 50 mg/dL — AB
KETONES UR: NEGATIVE mg/dL
Leukocytes, UA: NEGATIVE
Nitrite: POSITIVE — AB
PROTEIN: NEGATIVE mg/dL
Specific Gravity, Urine: 1.015 (ref 1.005–1.030)
pH: 6 (ref 5.0–8.0)

## 2015-08-16 LAB — CBC
HEMATOCRIT: 43 % (ref 35.0–47.0)
Hemoglobin: 13.6 g/dL (ref 12.0–16.0)
MCH: 28.2 pg (ref 26.0–34.0)
MCHC: 31.6 g/dL — AB (ref 32.0–36.0)
MCV: 89.4 fL (ref 80.0–100.0)
Platelets: 257 10*3/uL (ref 150–440)
RBC: 4.81 MIL/uL (ref 3.80–5.20)
RDW: 15.6 % — AB (ref 11.5–14.5)
WBC: 9.7 10*3/uL (ref 3.6–11.0)

## 2015-08-16 LAB — BASIC METABOLIC PANEL
Anion gap: 8 (ref 5–15)
BUN: 17 mg/dL (ref 6–20)
CHLORIDE: 100 mmol/L — AB (ref 101–111)
CO2: 25 mmol/L (ref 22–32)
Calcium: 9 mg/dL (ref 8.9–10.3)
Creatinine, Ser: 1.27 mg/dL — ABNORMAL HIGH (ref 0.44–1.00)
GFR calc Af Amer: 48 mL/min — ABNORMAL LOW (ref >60)
GFR, EST NON AFRICAN AMERICAN: 41 mL/min — AB (ref >60)
GLUCOSE: 143 mg/dL — AB (ref 65–99)
POTASSIUM: 4.6 mmol/L (ref 3.5–5.1)
Sodium: 133 mmol/L — ABNORMAL LOW (ref 135–145)

## 2015-08-16 LAB — PROTIME-INR
INR: 0.88
PROTHROMBIN TIME: 12.2 s (ref 11.4–15.0)

## 2015-08-16 LAB — HEPATIC FUNCTION PANEL
ALK PHOS: 134 U/L — AB (ref 38–126)
ALT: 11 U/L — AB (ref 14–54)
AST: 17 U/L (ref 15–41)
Albumin: 3.8 g/dL (ref 3.5–5.0)
Bilirubin, Direct: <0.1 mg/dL — ABNORMAL LOW (ref 0.1–0.5)
TOTAL PROTEIN: 7.6 g/dL (ref 6.5–8.1)
Total Bilirubin: 0.4 mg/dL (ref 0.3–1.2)

## 2015-08-16 LAB — BRAIN NATRIURETIC PEPTIDE: B Natriuretic Peptide: 32 pg/mL (ref 0.0–100.0)

## 2015-08-16 LAB — TROPONIN I: Troponin I: <0.03 ng/mL (ref <0.031)

## 2015-08-16 MED ORDER — IBUPROFEN 600 MG PO TABS
ORAL_TABLET | ORAL | Status: AC
  Filled 2015-08-16: qty 1

## 2015-08-16 MED ORDER — SODIUM CHLORIDE 0.9 % IV BOLUS (SEPSIS)
500.0000 mL | Freq: Once | INTRAVENOUS | Status: AC
  Administered 2015-08-16: 500 mL via INTRAVENOUS

## 2015-08-16 MED ORDER — LORAZEPAM 0.5 MG PO TABS
ORAL_TABLET | ORAL | Status: AC
  Filled 2015-08-16: qty 1

## 2015-08-16 MED ORDER — IBUPROFEN 600 MG PO TABS: 600.0000 mg | ORAL_TABLET | Freq: Once | ORAL | Status: DC

## 2015-08-16 MED ORDER — ACETAMINOPHEN 325 MG PO TABS: 650.0000 mg | ORAL_TABLET | Freq: Once | ORAL | Status: DC

## 2015-08-16 MED ORDER — CEFTRIAXONE SODIUM 1 G IJ SOLR
1.0000 g | Freq: Once | INTRAMUSCULAR | Status: AC
  Administered 2015-08-16: 1 g via INTRAVENOUS
  Filled 2015-08-16: qty 10

## 2015-08-16 MED ORDER — CEPHALEXIN 500 MG PO CAPS
500.0000 mg | ORAL_CAPSULE | Freq: Four times a day (QID) | ORAL | Status: AC
Start: 2015-08-16 — End: 2015-08-26

## 2015-08-16 MED ORDER — LORAZEPAM 0.5 MG PO TABS: 0.5000 mg | ORAL_TABLET | Freq: Once | ORAL | Status: DC

## 2015-08-16 NOTE — ED Notes (Signed)
RN went into room to give patient medications as ordered by MD. Patient asked RN what mg the Lorazepam was that MD ordered. Patient was informed that MD had ordered 0.5mg , patient then stated "I don't want it, I have that at home and I take 5 1 mg tablets." RN explained to patient that due to the fact that patient was drowsy when she first arrived MD did not want to administer anything that would be sedating to her. Patient stated " I was not drowsy, I just have a headache." RN again offered medication that MD had ordered and patient refused medication again.

## 2015-08-16 NOTE — ED Notes (Signed)
RN went into patient room to administer IV antibiotic ordered by MD. Patient stated to RN "the redheaded bitch is about to come out". RN asked patient what she meant. Patient stated "I want the doctor to go back and look in my chart, every time I am here and I need something for pain they give me Dilaudid." Patient then stated that "If he does not give me Dilaudid I want everything unhooked and I want to go home." RN asked if patient would be willing to take the IV antibiotic ordered and patient stated "not unless he gives me dilaudid for my headache."

## 2015-08-16 NOTE — Discharge Instructions (Signed)
Syncope You have declined further care in the emergency department which is certainly your choice but does limit what we can do to help you with your possible urinary tract infection and your passing out. As you do not wish to be admitted to the hospital we will discharge you home. Return to the emergency room if you feel worse in any significant way. Syncope is a medical term for fainting or passing out. This means you lose consciousness and drop to the ground. People are generally unconscious for less than 5 minutes. You may have some muscle twitches for up to 15 seconds before waking up and returning to normal. Syncope occurs more often in older adults, but it can happen to anyone. While most causes of syncope are not dangerous, syncope can be a sign of a serious medical problem. It is important to seek medical care.  CAUSES  Syncope is caused by a sudden drop in blood flow to the brain. The specific cause is often not determined. Factors that can bring on syncope include:  Taking medicines that lower blood pressure.  Sudden changes in posture, such as standing up quickly.  Taking more medicine than prescribed.  Standing in one place for too long.  Seizure disorders.  Dehydration and excessive exposure to heat.  Low blood sugar (hypoglycemia).  Straining to have a bowel movement.  Heart disease, irregular heartbeat, or other circulatory problems.  Fear, emotional distress, seeing blood, or severe pain. SYMPTOMS  Right before fainting, you may:  Feel dizzy or light-headed.  Feel nauseous.  See all white or all black in your field of vision.  Have cold, clammy skin. DIAGNOSIS  Your health care provider will ask about your symptoms, perform a physical exam, and perform an electrocardiogram (ECG) to record the electrical activity of your heart. Your health care provider may also perform other heart or blood tests to determine the cause of your syncope which may  include:  Transthoracic echocardiogram (TTE). During echocardiography, sound waves are used to evaluate how blood flows through your heart.  Transesophageal echocardiogram (TEE).  Cardiac monitoring. This allows your health care provider to monitor your heart rate and rhythm in real time.  Holter monitor. This is a portable device that records your heartbeat and can help diagnose heart arrhythmias. It allows your health care provider to track your heart activity for several days, if needed.  Stress tests by exercise or by giving medicine that makes the heart beat faster. TREATMENT  In most cases, no treatment is needed. Depending on the cause of your syncope, your health care provider may recommend changing or stopping some of your medicines. HOME CARE INSTRUCTIONS  Have someone stay with you until you feel stable.  Do not drive, use machinery, or play sports until your health care provider says it is okay.  Keep all follow-up appointments as directed by your health care provider.  Lie down right away if you start feeling like you might faint. Breathe deeply and steadily. Wait until all the symptoms have passed.  Drink enough fluids to keep your urine clear or pale yellow.  If you are taking blood pressure or heart medicine, get up slowly and take several minutes to sit and then stand. This can reduce dizziness. SEEK IMMEDIATE MEDICAL CARE IF:   You have a severe headache.  You have unusual pain in the chest, abdomen, or back.  You are bleeding from your mouth or rectum, or you have black or tarry stool.  You have an  irregular or very fast heartbeat.  You have pain with breathing.  You have repeated fainting or seizure-like jerking during an episode.  You faint when sitting or lying down.  You have confusion.  You have trouble walking.  You have severe weakness.  You have vision problems. If you fainted, call your local emergency services (911 in [place] Do not drive  yourself to the hospital.    This information is not intended to replace advice given to you by your health care provider. Make sure you discuss any questions you have with your health care provider.   Document Released: 09/19/2005 Document Revised: 02/03/2015 Document Reviewed: 11/18/2011 Elsevier Interactive Patient Education [employer]2016 [employer] Inc[employer]

## 2015-08-16 NOTE — ED Notes (Signed)
Patient was found by her husband laying in the front yard s/p unwitnessed fall. Suspected downtime of 5-10 minutes. History of seizure without medication treatment regimen. Patient initially presented as Altered, however symptoms resolved with EMS.

## 2015-08-16 NOTE — ED Notes (Signed)
Patient requesting pain medication for headache. Patient stated "I don't want tylenol either, I want Dilaudid." MD notified.

## 2015-08-16 NOTE — ED Provider Notes (Addendum)
[facility] Emergency Department Provider Note  ____________________________________________   I have reviewed the triage vital signs and the nursing notes.   HISTORY  Chief Complaint Weakness    HPI Jill Durham is a 69 y.o. female with a significant past medical history of prior TIAs, hypertension, anxiety and depression, diabetes mellitus, hypercholesterol, depression, and recurrent syncopal events resents today complaining of having passed out. She does not remember what happened. Her husband states she was out for 15 minutes. Review of prior notes is not indicated actual seizure disorder in the past, according to what information I can obtain her prior charting, patient has syncope without known cause. She has had some extensive workup for this including MRI. In any event, the patient did have a urinary tract infection until last week and was treated with Cipro, she states sometimes it still burns when she urinates. She states she finished the meds. Patient has also been admitted to the hospital for taking too many benzodiazepine medication and her husband states that she was recently switched amitriptyline to Valium to help with her peripheral neuropathy. She takes 1 pill at night which he states. However, he states that the patient is not in control of her medication and has not had that today. According to the husband, he left her alone on the porch to go check the neighbors and when he came back she was on the ground near the porch. No seizure activity was witnessed. She is now back to her baseline although seems a little bit sleepy. The patient has had no recent fever or chills or cough. She denies chest pain or abdominal pain. Initially she had no pain complain of any variety.  Past Medical History  Diagnosis Date  . Diabetes mellitus without complication (HCC)   . MI, old   . Seizures (HCC)   . Hypertension   . Stroke (HCC)     tia  . Anxiety and  depression     Patient Active Problem List   Diagnosis Date Noted  . Chronic depression 05/16/2015  . 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 Generalized weakness 05/16/2015  . Dyspnea 05/16/2015  . Renal insufficiency 05/16/2015  . Acute cystitis without hematuria 05/16/2015  . Diabetes (HCC) 05/16/2015  . Benzodiazepine overdose 05/14/2015  . Secondary hypertension, unspecified 08/20/2014  . Acute kidney injury (HCC) 08/20/2014  . UTI (lower urinary tract infection) 08/20/2014  . H/O: CVA (cerebrovascular accident) 08/20/2014  . History of MI (myocardial infarction) 08/20/2014  . Tobacco abuse 08/20/2014  . Syncope 08/18/2014  . Headache 08/18/2014  . Diabetes type 2, controlled (HCC) 08/18/2014  . Hypertensive emergency 08/18/2014    Past Surgical History  Procedure Laterality Date  . Abdominal surgery    . Bladder surgery    . Rectal surgery    . Abdominal hysterectomy      partial  . Cardiac surgery      CABG    Current Outpatient Rx  Name  Route  Sig  Dispense  Refill  . ALPRAZolam (XANAX) 0.25 MG tablet   Oral   Take 0.25 mg by mouth daily as needed for anxiety.         Marland Kitchen amitriptyline (ELAVIL) 75 MG tablet   Oral   Take 75 mg by mouth at bedtime.         Marland Kitchen amLODipine (NORVASC) 5 MG tablet   Oral   Take 5 mg by mouth daily.         Marland Kitchen aspirin  EC 81 MG tablet   Oral   Take 81 mg by mouth 2 (two) times daily.         . cephALEXin (KEFLEX) 500 MG capsule   Oral   Take 1 capsule (500 mg total) by mouth 4 (four) times daily.   14 capsule   0   . diphenhydrAMINE (BENADRYL) 25 MG tablet   Oral   Take 50 mg by mouth daily as needed (for emergency purpose).         Marland Kitchen. EPIPEN 2-PAK 0.3 MG/0.3ML SOAJ injection               . feeding supplement, GLUCERNA SHAKE, (GLUCERNA SHAKE) LIQD   Oral   Take 237 mLs by mouth 3 (three) times daily with meals.   40 Can   3   . FLUoxetine (PROZAC) 40 MG capsule   Oral   Take 40 mg by mouth  daily.         . Multiple Vitamins-Minerals (ADULT GUMMY) CHEW   Oral   Chew 1 tablet by mouth daily.         Marland Kitchen. omeprazole (PRILOSEC) 40 MG capsule   Oral   Take 40 mg by mouth 2 (two) times daily.         . simvastatin (ZOCOR) 20 MG tablet   Oral   Take 20 mg by mouth at bedtime.           Allergies Food and Erythromycin  Family History  Problem Relation Age of Onset  . Diabetes Other     Social History Social History  Substance Use Topics  . Smoking status: Current Every Day Smoker -- 1.00 packs/day    Types: Cigarettes  . Smokeless tobacco: None  . Alcohol Use: No    Review of Systems Constitutional: No fever/chills Eyes: No visual changes. ENT: No sore throat. No stiff neck no neck pain Cardiovascular: Denies chest pain. Respiratory: Denies shortness of breath. Gastrointestinal:   no vomiting.  No diarrhea.  No constipation. Genitourinary: Recently treated for urinary tract infection has mild residual dysuria Musculoskeletal: Negative lower extremity swelling Skin: Negative for rash. Neurological: Negative for headaches, focal weakness or numbness. 10-point ROS otherwise negative.  ____________________________________________   PHYSICAL EXAM:  VITAL SIGNS: ED Triage Vitals  Enc Vitals Group     BP 08/16/15 1505 131/67 mmHg     Pulse Rate 08/16/15 1505 80     Resp 08/16/15 1505 16     Temp 08/16/15 1505 98.6 F (37 C)     Temp src --      SpO2 08/16/15 1505 95 %     Weight 08/16/15 1505 149 lb (67.586 kg)     Height 08/16/15 1505 5\' 5"  (1.651 m)     Head Cir --      Peak Flow --      Pain Score --      Pain Loc --      Pain Edu? --      Excl. in GC? --     Constitutional: Alert and oriented. Well appearing and in no acute distress. She is slightly somnolent, Eyes: Conjunctivae are normal. PERRL. EOMI. Head: Atraumatic. Nose: No congestion/rhinnorhea. Mouth/Throat: Mucous membranes are moist.  Oropharynx non-erythematous. Neck: No  stridor.   Nontender with no meningismus Cardiovascular: Normal rate, regular rhythm. Grossly normal heart sounds.  Good peripheral circulation. Respiratory: Normal respiratory effort.  No retractions. Lungs CTAB. Abdominal: Soft and nontender. No distention. No guarding no rebound Back:  There is no focal tenderness or step off there is no midline tenderness there are no lesions noted. there is no CVA tenderness Musculoskeletal: No lower extremity tenderness. No joint effusions, no DVT signs strong distal pulses no edema Neurologic:  Normal speech and language. No gross focal neurologic deficits are appreciated.  Skin:  Skin is warm, dry and intact. No rash noted. Psychiatric: Mood and affect are normal. Speech and behavior are normal.  ____________________________________________   LABS (all labs ordered are listed, but only abnormal results are displayed)  Labs Reviewed  BASIC METABOLIC PANEL - Abnormal; Notable for the following:    Sodium 133 (*)    Chloride 100 (*)    Glucose, Bld 143 (*)    Creatinine, Ser 1.27 (*)    GFR calc non Af Amer 41 (*)    GFR calc Af Amer 48 (*)    All other components within normal limits  CBC - Abnormal; Notable for the following:    MCHC 31.6 (*)    RDW 15.6 (*)    All other components within normal limits  URINALYSIS COMPLETEWITH MICROSCOPIC ([facility] ONLY) - Abnormal; Notable for the following:    Color, Urine YELLOW (*)    APPearance CLOUDY (*)    Glucose, UA 50 (*)    Hgb urine dipstick 1+ (*)    Nitrite POSITIVE (*)    Bacteria, UA RARE (*)    Squamous Epithelial / LPF 0-5 (*)    All other components within normal limits  HEPATIC FUNCTION PANEL - Abnormal; Notable for the following:    ALT 11 (*)    Alkaline Phosphatase 134 (*)    Bilirubin, Direct <0.1 (*)    All other components within normal limits  URINE CULTURE  TROPONIN I  PROTIME-INR  BRAIN NATRIURETIC PEPTIDE   ____________________________________________  EKG  I  personally interpreted any EKGs ordered by me or triage Normal sinus rhythm rate 79 bpm no acute ST elevation or depression, possible old inferior ischemia noted. No active ischemia at this time. LAD noted. ____________________________________________  RADIOLOGY  I reviewed any imaging ordered by me or triage that were performed during my shift ____________________________________________   PROCEDURES  Procedure(s) performed: None  Critical Care performed: None  ____________________________________________   INITIAL IMPRESSION / ASSESSMENT AND PLAN / ED COURSE  Pertinent labs & imaging results that were available during my care of the patient were reviewed by me and considered in my medical decision making (see chart for details).  Patient's exam is reassuring she is able to ambulate here . there is no evidence of acute fracture. However, she was unconscious for approximately 5 -10  minutes which is apparently not unusual for her, does not appear to have bitten her tongue or had any seizure activity. The patient has what appears to be possibly residual urinary tract infection with positive nitrites in her urine and I will give her antibiotics for that. The rest of her blood work and vital signs are reassuring. It is certainly concerning her over the patient was unconscious    ----------------------------------------- 5:06 PM on 08/16/2015 -----------------------------------------  Serial neurologic exams are reassuring. I have explained to the patient given the fact that she may have a urinary tract infection, and she passed out, admission to the hospital would be a not unwarranted action however, she and her husband refused. They have had extensive and comprehensive workup for her syncopal events which happened with a regular basis and they do not wish to be admitted to  the hospital. This is really not unreasonable given how many times his been workup but again it does limit what I can  do in the emergency room to ensure her safety and she agrees with this. They both know that there is some danger to going home. Review of prior culture data suggests that the patient has pansensitive Escherichia coli in the past. Is unclear if she actually has a urinary tract infection or not at this time. She certainly does not appear septic. The patient is being given here Rocephin on the off chance that she continues to have a urinary tract infection we'll start her on Keflex to which her cultures have been sensitive in the past. Given family refusal and patient refusal for admission we will discharge her on antibiotics with extensive return precautions.   ----------------------------------------- 5:15 PM on 08/16/2015 -----------------------------------------  Patient has began to demand IV Dilaudid medication. Initially she said this was because she was hurting "all over" and then she said it is because she is starting to develop a headache. I've explained to her I'm not going to treat her incipient headache with Dilaudid. Patient is neurovascularly intact at this time. I'm concerned about overmedication with her at baseline and I'm not going to add narcotic pain medication. Patient is very angry about this. She has told me that every time she comes her she always gets Dilaudid and she feels that she should have Dilaudid this time. She says if I do not give her Dilaudid pain medication she is going to pull out all her lines and go home. I certainly am not going to be held hostage in this manner for high-dose narcotic pain medication which I do not think is appropriate at this time. I have offered her pain medication which she refuses. I have offered her IV antibiotics for her possible urinary tract infection which she at this time is refusing. I feel that she is competent to make her choices and we will discharge her with her husband who will take her home. Patient is refusing any further intervention at  this time  ____________________________________________   ----------------------------------------- 5:27 PM on 08/16/2015 -----------------------------------------  Patient states she didn't actually need Dilaudid which she did with something for her nerves, which I believe. She is asking for antianxiety medication and some Motrin which we will give her. In this context patient does then consent to stay for antibiotics prior to departure   FINAL CLINICAL IMPRESSION(S) / ED DIAGNOSES  Final diagnoses:  None     Jeanmarie Plant, MD 08/16/15 1649  Jeanmarie Plant, MD 08/16/15 1708  Jeanmarie Plant, MD 08/16/15 1717  Jeanmarie Plant, MD 08/16/15 1728

## 2015-08-16 NOTE — ED Notes (Signed)
Medications given to patients husband, Onalee HuaDavid

## 2015-08-18 LAB — URINE CULTURE

## 2015-09-02 ENCOUNTER — Encounter: Payer: Self-pay | Admitting: Emergency Medicine

## 2015-09-02 ENCOUNTER — Emergency Department: Payer: Commercial Managed Care - HMO

## 2015-09-02 ENCOUNTER — Emergency Department
Admission: EM | Admit: 2015-09-02 | Discharge: 2015-09-02 | Disposition: A | Discharge status: Home / Self Care | Payer: Commercial Managed Care - HMO | Attending: Emergency Medicine | Admitting: Emergency Medicine

## 2015-09-02 DIAGNOSIS — G629 Polyneuropathy, unspecified: Secondary | ICD-10-CM | POA: Diagnosis not present

## 2015-09-02 DIAGNOSIS — I10 Essential (primary) hypertension: Secondary | ICD-10-CM | POA: Diagnosis not present

## 2015-09-02 DIAGNOSIS — Z7982 Long term (current) use of aspirin: Secondary | ICD-10-CM | POA: Insufficient documentation

## 2015-09-02 DIAGNOSIS — E119 Type 2 diabetes mellitus without complications: Secondary | ICD-10-CM | POA: Diagnosis not present

## 2015-09-02 DIAGNOSIS — G8929 Other chronic pain: Secondary | ICD-10-CM | POA: Diagnosis not present

## 2015-09-02 DIAGNOSIS — S0990XA Unspecified injury of head, initial encounter: Secondary | ICD-10-CM | POA: Diagnosis not present

## 2015-09-02 DIAGNOSIS — F419 Anxiety disorder, unspecified: Secondary | ICD-10-CM

## 2015-09-02 DIAGNOSIS — G5793 Unspecified mononeuropathy of bilateral lower limbs: Secondary | ICD-10-CM | POA: Diagnosis not present

## 2015-09-02 DIAGNOSIS — F17210 Nicotine dependence, cigarettes, uncomplicated: Secondary | ICD-10-CM | POA: Insufficient documentation

## 2015-09-02 DIAGNOSIS — R202 Paresthesia of skin: Secondary | ICD-10-CM | POA: Diagnosis not present

## 2015-09-02 DIAGNOSIS — R55 Syncope and collapse: Secondary | ICD-10-CM | POA: Diagnosis not present

## 2015-09-02 DIAGNOSIS — Z79899 Other long term (current) drug therapy: Secondary | ICD-10-CM | POA: Diagnosis not present

## 2015-09-02 DIAGNOSIS — R42 Dizziness and giddiness: Secondary | ICD-10-CM | POA: Insufficient documentation

## 2015-09-02 LAB — CBC
HEMATOCRIT: 42.9 % (ref 35.0–47.0)
HEMOGLOBIN: 13.9 g/dL (ref 12.0–16.0)
MCH: 29 pg (ref 26.0–34.0)
MCHC: 32.4 g/dL (ref 32.0–36.0)
MCV: 89.5 fL (ref 80.0–100.0)
Platelets: 237 10*3/uL (ref 150–440)
RBC: 4.79 MIL/uL (ref 3.80–5.20)
RDW: 15.5 % — ABNORMAL HIGH (ref 11.5–14.5)
WBC: 8.3 10*3/uL (ref 3.6–11.0)

## 2015-09-02 LAB — BASIC METABOLIC PANEL
ANION GAP: 8 (ref 5–15)
BUN: 18 mg/dL (ref 6–20)
CALCIUM: 9.3 mg/dL (ref 8.9–10.3)
CO2: 29 mmol/L (ref 22–32)
Chloride: 102 mmol/L (ref 101–111)
Creatinine, Ser: 1.26 mg/dL — ABNORMAL HIGH (ref 0.44–1.00)
GFR, EST AFRICAN AMERICAN: 48 mL/min — AB (ref >60)
GFR, EST NON AFRICAN AMERICAN: 42 mL/min — AB (ref >60)
Glucose, Bld: 162 mg/dL — ABNORMAL HIGH (ref 65–99)
POTASSIUM: 4.1 mmol/L (ref 3.5–5.1)
SODIUM: 139 mmol/L (ref 135–145)

## 2015-09-02 LAB — URINALYSIS COMPLETE WITH MICROSCOPIC (ARMC ONLY)
BACTERIA UA: NONE SEEN
BILIRUBIN URINE: NEGATIVE
Glucose, UA: NEGATIVE mg/dL
Hgb urine dipstick: NEGATIVE
KETONES UR: NEGATIVE mg/dL
LEUKOCYTES UA: NEGATIVE
NITRITE: NEGATIVE
PH: 5 (ref 5.0–8.0)
PROTEIN: NEGATIVE mg/dL
SPECIFIC GRAVITY, URINE: 1.018 (ref 1.005–1.030)

## 2015-09-02 LAB — TROPONIN I: Troponin I: <0.03 ng/mL (ref <0.031)

## 2015-09-02 MED ORDER — IBUPROFEN 600 MG PO TABS: 600.0000 mg | ORAL_TABLET | Freq: Once | ORAL | Status: DC

## 2015-09-02 MED ORDER — SODIUM CHLORIDE 0.9 % IV BOLUS (SEPSIS)
500.0000 mL | Freq: Once | INTRAVENOUS | Status: AC
  Administered 2015-09-02: 500 mL via INTRAVENOUS

## 2015-09-02 NOTE — Discharge Instructions (Signed)
You were evaluated after passing out today called syncope. Exam and evaluation are reassuring. In terms of your chronic anxiety and leg pain/neuropathy, I recommend you follow-up with her primary care physician, and I have also included follow-up information for [facility] for evaluation for anxiety.  Return to the emergency department for any worsening condition including any chest pain, palpitations, trouble breathing, shortness of breath, weakness on one side or the other, slurred speech, any altered mental status or confusion, or any other symptoms concerning to you.  Make sure you drink plenty of fluids. Take time moving from sitting to standing position and sit down if you feel lightheaded or dizzy.   Syncope Syncope is a medical term for fainting or passing out. This means you lose consciousness and drop to the ground. People are generally unconscious for less than 5 minutes. You may have some muscle twitches for up to 15 seconds before waking up and returning to normal. Syncope occurs more often in older adults, but it can happen to anyone. While most causes of syncope are not dangerous, syncope can be a sign of a serious medical problem. It is important to seek medical care.  CAUSES  Syncope is caused by a sudden drop in blood flow to the brain. The specific cause is often not determined. Factors that can bring on syncope include:  Taking medicines that lower blood pressure.  Sudden changes in posture, such as standing up quickly.  Taking more medicine than prescribed.  Standing in one place for too long.  Seizure disorders.  Dehydration and excessive exposure to heat.  Low blood sugar (hypoglycemia).  Straining to have a bowel movement.  Heart disease, irregular heartbeat, or other circulatory problems.  Fear, emotional distress, seeing blood, or severe pain. SYMPTOMS  Right before fainting, you may:  Feel dizzy or light-headed.  Feel nauseous.  See all white or all black in  your field of vision.  Have cold, clammy skin. DIAGNOSIS  Your health care provider will ask about your symptoms, perform a physical exam, and perform an electrocardiogram (ECG) to record the electrical activity of your heart. Your health care provider may also perform other heart or blood tests to determine the cause of your syncope which may include:  Transthoracic echocardiogram (TTE). During echocardiography, sound waves are used to evaluate how blood flows through your heart.  Transesophageal echocardiogram (TEE).  Cardiac monitoring. This allows your health care provider to monitor your heart rate and rhythm in real time.  Holter monitor. This is a portable device that records your heartbeat and can help diagnose heart arrhythmias. It allows your health care provider to track your heart activity for several days, if needed.  Stress tests by exercise or by giving medicine that makes the heart beat faster. TREATMENT  In most cases, no treatment is needed. Depending on the cause of your syncope, your health care provider may recommend changing or stopping some of your medicines. HOME CARE INSTRUCTIONS  Have someone stay with you until you feel stable.  Do not drive, use machinery, or play sports until your health care provider says it is okay.  Keep all follow-up appointments as directed by your health care provider.  Lie down right away if you start feeling like you might faint. Breathe deeply and steadily. Wait until all the symptoms have passed.  Drink enough fluids to keep your urine clear or pale yellow.  If you are taking blood pressure or heart medicine, get up slowly and take several minutes to  sit and then stand. This can reduce dizziness. SEEK IMMEDIATE MEDICAL CARE IF:   You have a severe headache.  You have unusual pain in the chest, abdomen, or back.  You are bleeding from your mouth or rectum, or you have black or tarry stool.  You have an irregular or very fast  heartbeat.  You have pain with breathing.  You have repeated fainting or seizure-like jerking during an episode.  You faint when sitting or lying down.  You have confusion.  You have trouble walking.  You have severe weakness.  You have vision problems. If you fainted, call your local emergency services (911 in [place] Do not drive yourself to the hospital.    This information is not intended to replace advice given to you by your health care provider. Make sure you discuss any questions you have with your health care provider.   Document Released: 09/19/2005 Document Revised: 02/03/2015 Document Reviewed: 11/18/2011 Elsevier Interactive Patient Education [employer]

## 2015-09-02 NOTE — ED Notes (Signed)
Unable to have patient to stand,  patient state's she can't stand because her legs hurts and she feel's like she's going jump out of her skin.

## 2015-09-02 NOTE — ED Notes (Signed)
Pt appears to become more and more agitated with staff stating, "either you give me something for my nerves or I'm leaving". This RN notified MD, no new orders at this time.

## 2015-09-02 NOTE — ED Notes (Signed)
Per EMS pt was taking a bath when she stood up she became weak and had a syncopal episode. Pt states she does not remember event however her husband was assisting and states she did lose consciousness. Per EMS pt also c/o severe bilateral leg pain for 1 week, and decreased appetite x 3 weeks. Pt states, however, that she is maintaining adequate hydration at this time. Pt states she does not believe that she hit her head. 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

## 2015-09-02 NOTE — ED Provider Notes (Signed)
[facility] Emergency Department Provider Note   ____________________________________________  Time seen:  I have reviewed the triage vital signs and the triage nursing note.  HISTORY  Chief Complaint Loss of Consciousness and Leg Pain   Historian Patient  HPI Jill Durham is a 69 y.o. female who is here for evaluation after syncopal episode. Patient states that for approximately 3-4 weeks she's had generalized malaise, decreased energy level, and weakness all over including decreased appetite. Today when she woke up she felt little lightheaded and after she took a bath and was sitting down to dry herself off she still felt lightheaded. When she stood up she called out to her husband and he states that when he got in there she had lost consciousness. They are unsure whether or not she struck her head. She did come back around easily. No seizure activity noted.  No recent fevers, coughing, vomiting or diarrhea.No chest pain or trouble breathing.    Past Medical History  Diagnosis Date  . Diabetes mellitus without complication (HCC)   . MI, old   . Seizures (HCC)   . Hypertension   . Stroke (HCC)     tia  . Anxiety and depression     Patient Active Problem List   Diagnosis Date Noted  . Chronic depression 05/16/2015  . 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 Generalized weakness 05/16/2015  . Dyspnea 05/16/2015  . Renal insufficiency 05/16/2015  . Acute cystitis without hematuria 05/16/2015  . Diabetes (HCC) 05/16/2015  . Benzodiazepine overdose 05/14/2015  . Secondary hypertension, unspecified 08/20/2014  . Acute kidney injury (HCC) 08/20/2014  . UTI (lower urinary tract infection) 08/20/2014  . H/O: CVA (cerebrovascular accident) 08/20/2014  . History of MI (myocardial infarction) 08/20/2014  . Tobacco abuse 08/20/2014  . Syncope 08/18/2014  . Headache 08/18/2014  . Diabetes type 2, controlled (HCC) 08/18/2014  . Hypertensive  emergency 08/18/2014    Past Surgical History  Procedure Laterality Date  . Abdominal surgery    . Bladder surgery    . Rectal surgery    . Abdominal hysterectomy      partial  . Cardiac surgery      CABG    Current Outpatient Rx  Name  Route  Sig  Dispense  Refill  . amiodarone (PACERONE) 200 MG tablet   Oral   Take 200 mg by mouth daily.         Marland Kitchen amLODipine (NORVASC) 5 MG tablet   Oral   Take 5 mg by mouth daily.         Marland Kitchen aspirin EC 81 MG tablet   Oral   Take 81 mg by mouth 2 (two) times daily.         . diazepam (VALIUM) 5 MG tablet   Oral   Take 5 mg by mouth at bedtime.         . diphenhydrAMINE (BENADRYL) 25 MG tablet   Oral   Take 50 mg by mouth daily as needed for itching or allergies.          Marland Kitchen enalapril (VASOTEC) 5 MG tablet   Oral   Take 5 mg by mouth 2 (two) times daily.         Marland Kitchen EPIPEN 2-PAK 0.3 MG/0.3ML SOAJ injection   Intramuscular   Inject 0.3 mg into the muscle as needed (for allergic reaction).          . feeding supplement, GLUCERNA SHAKE, (GLUCERNA SHAKE) LIQD   Oral  Take 237 mLs by mouth 3 (three) times daily with meals.   40 Can   3   . LORazepam (ATIVAN) 1 MG tablet   Oral   Take 1 mg by mouth 2 (two) times daily.         . Multiple Vitamins-Minerals (ADULT GUMMY) CHEW   Oral   Chew 1 tablet by mouth daily.         Marland Kitchen zolpidem (AMBIEN) 10 MG tablet   Oral   Take 10 mg by mouth at bedtime.           Allergies Food and Erythromycin  Family History  Problem Relation Age of Onset  . Diabetes Other     Social History Social History  Substance Use Topics  . Smoking status: Current Every Day Smoker -- 0.50 packs/day    Types: Cigarettes  . Smokeless tobacco: None  . Alcohol Use: No    Review of Systems  Constitutional: Negative for fever. Eyes: Negative for visual changes. ENT: Negative for sore throat. Cardiovascular: Negative for chest pain. Respiratory: Negative for shortness of  breath. Gastrointestinal: Negative for abdominal pain, vomiting and diarrhea. Genitourinary: Negative for dysuria. Musculoskeletal: Negative for back pain. Skin: Negative for rash. Neurological: Negative for headache. Chronic bilateral lower extremity tingling pain which she has been told is neuropathy. 10 point Review of Systems otherwise negative ____________________________________________   PHYSICAL EXAM:  VITAL SIGNS: ED Triage Vitals  Enc Vitals Group     BP 09/02/15 1029 132/62 mmHg     Pulse Rate 09/02/15 1030 70     Resp 09/02/15 1029 20     Temp 09/02/15 1029 98.8 F (37.1 C)     Temp Source 09/02/15 1029 Oral     SpO2 09/02/15 1025 98 %     Weight 09/02/15 1029 149 lb (67.586 kg)     Height 09/02/15 1029  (1.651 m)     Head Cir --      Peak Flow --      Pain Score 09/02/15 1030 10     Pain Loc --      Pain Edu? --      Excl. in GC? --      Constitutional: Alert and oriented. Well appearing and in no distress. Eyes: Conjunctivae are normal. PERRL. Normal extraocular movements. ENT   Head: Normocephalic and atraumatic.   Nose: No congestion/rhinnorhea.   Mouth/Throat: Mucous membranes are mildly dry.   Neck: No stridor. Cardiovascular/Chest: Normal rate, regular rhythm.  No murmurs, rubs, or gallops. Respiratory: Normal respiratory effort without tachypnea nor retractions. Breath sounds are clear and equal bilaterally. No wheezes/rales/rhonchi. Gastrointestinal: Soft. No distention, no guarding, no rebound. Nontender   Genitourinary/rectal:Deferred Musculoskeletal: Nontender with normal range of motion in all extremities. No joint effusions.  No lower extremity tenderness.  No edema. Neurologic:  Normal speech and language. No gross or focal neurologic deficits are appreciated. Skin:  Skin is warm, dry and intact. No rash noted. Psychiatric: Mood and affect are normal. Speech and behavior are normal. Patient exhibits appropriate insight and  judgment.  ____________________________________________   EKG I, Governor Rooks, MD, the attending physician have personally viewed and interpreted all ECGs.  69 bpm. Normal sinus rhythm. Nonspecific intraventricular conduction delay. Left axis deviation. Nonspecific ST and T-wave ____________________________________________  LABS (pertinent positives/negatives)  Urinalysis negative Basic metabolic panel without significant abnormality. Creatinine 1.26 CBC without significant abnormality. Hemoglobin 13.9 Troponin less than 0.03  ____________________________________________  RADIOLOGY All Xrays were viewed by me. Imaging  interpreted by Radiologist.  CT head noncontrast:   IMPRESSION: Mild diffuse cortical atrophy. Moderate chronic ischemic white matter disease. No acute intracranial abnormality seen. __________________________________________  PROCEDURES  Procedure(s) performed: None  Critical Care performed: None  ____________________________________________   ED COURSE / ASSESSMENT AND PLAN  CONSULTATIONS: None  Pertinent labs & imaging results that were available during my care of the patient were reviewed by me and considered in my medical decision making (see chart for details).  Here in the ED patient is overall well-appearing but she does look a little cachectic and is complaining of generalized malaise for several weeks. No chest pain or trouble breathing. No fevers or recent illnesses. The acute issue that actually caused her to come to the ED is a syncopal event just prior to arrival after taking a bath and upon standing. There were no neurologic complaints or cardiac complaints before or after. I suspect likely orthostatic.  Her exam and evaluation are reassuring.  Patient's main complaint further end of the visit became anxiety. It sounds like this is chronic and she does take Valium at home. I discussed with her that I don't want to prescribe any additional  of benzodiazepine. I prefer to follow up with [facility] or her prescribing provider.  In terms of her lower extremity discomfort, sound like this is the chronic neuropathy that she's had mostly in one leg but now it's really in both legs. There is no swelling, no calf tenderness and I do not suspect a DVT.  Patient / Family / Caregiver informed of clinical course, medical decision-making process, and agree with plan.   I discussed return precautions, follow-up instructions, and discharged instructions with patient and/or family.  ___________________________________________   FINAL CLINICAL IMPRESSION(S) / ED DIAGNOSES   Final diagnoses:  Syncope, unspecified syncope type  Neuropathy [facility](HCC)  Anxiety       Governor Rooksebecca Hershy Flenner, MD 09/02/15 1347

## 2015-09-02 NOTE — ED Notes (Signed)
Patient transported to CT 

## 2015-09-02 NOTE — ED Notes (Signed)
This RN called to bedside. Pt states she "feels like she is coming out of her skin". Pt states she does not normally take anything for anxiety during the day. MD did not give order for medication. Pt also c/o pain to both of her legs. MD gave order for 600 Ibuprofen, pt refused. Pt tearful at this time stating, "I'm not a doctor but I know what they can give me".

## 2015-09-05 DIAGNOSIS — E785 Hyperlipidemia, unspecified: Secondary | ICD-10-CM | POA: Diagnosis not present

## 2015-09-05 DIAGNOSIS — R404 Transient alteration of awareness: Secondary | ICD-10-CM | POA: Diagnosis not present

## 2015-09-05 DIAGNOSIS — I10 Essential (primary) hypertension: Secondary | ICD-10-CM | POA: Diagnosis not present

## 2015-09-05 DIAGNOSIS — I252 Old myocardial infarction: Secondary | ICD-10-CM | POA: Diagnosis not present

## 2015-09-05 DIAGNOSIS — R55 Syncope and collapse: Secondary | ICD-10-CM | POA: Diagnosis not present

## 2015-09-05 DIAGNOSIS — F419 Anxiety disorder, unspecified: Secondary | ICD-10-CM | POA: Diagnosis not present

## 2015-09-05 DIAGNOSIS — E876 Hypokalemia: Secondary | ICD-10-CM | POA: Diagnosis not present

## 2015-09-05 DIAGNOSIS — J449 Chronic obstructive pulmonary disease, unspecified: Secondary | ICD-10-CM | POA: Diagnosis not present

## 2015-09-05 DIAGNOSIS — E1140 Type 2 diabetes mellitus with diabetic neuropathy, unspecified: Secondary | ICD-10-CM | POA: Diagnosis not present

## 2015-09-05 DIAGNOSIS — Z8673 Personal history of transient ischemic attack (TIA), and cerebral infarction without residual deficits: Secondary | ICD-10-CM | POA: Diagnosis not present

## 2015-09-05 DIAGNOSIS — I4891 Unspecified atrial fibrillation: Secondary | ICD-10-CM | POA: Diagnosis not present

## 2015-09-06 DIAGNOSIS — R55 Syncope and collapse: Secondary | ICD-10-CM | POA: Diagnosis not present

## 2015-09-06 DIAGNOSIS — E876 Hypokalemia: Secondary | ICD-10-CM | POA: Diagnosis not present

## 2015-09-06 DIAGNOSIS — E1140 Type 2 diabetes mellitus with diabetic neuropathy, unspecified: Secondary | ICD-10-CM | POA: Diagnosis not present

## 2015-09-06 DIAGNOSIS — I10 Essential (primary) hypertension: Secondary | ICD-10-CM | POA: Diagnosis not present

## 2015-09-10 DIAGNOSIS — E118 Type 2 diabetes mellitus with unspecified complications: Secondary | ICD-10-CM | POA: Diagnosis not present

## 2015-09-10 DIAGNOSIS — I4891 Unspecified atrial fibrillation: Secondary | ICD-10-CM | POA: Diagnosis not present

## 2015-09-10 DIAGNOSIS — R55 Syncope and collapse: Secondary | ICD-10-CM | POA: Diagnosis not present

## 2015-09-10 DIAGNOSIS — I69821 Dysphasia following other cerebrovascular disease: Secondary | ICD-10-CM | POA: Diagnosis not present

## 2015-10-28 DIAGNOSIS — H8110 Benign paroxysmal vertigo, unspecified ear: Secondary | ICD-10-CM | POA: Diagnosis not present

## 2015-10-28 DIAGNOSIS — H8113 Benign paroxysmal vertigo, bilateral: Secondary | ICD-10-CM | POA: Diagnosis not present

## 2015-10-28 DIAGNOSIS — R404 Transient alteration of awareness: Secondary | ICD-10-CM | POA: Diagnosis not present

## 2015-10-28 DIAGNOSIS — R55 Syncope and collapse: Secondary | ICD-10-CM | POA: Diagnosis not present

## 2015-11-15 DIAGNOSIS — Z7901 Long term (current) use of anticoagulants: Secondary | ICD-10-CM | POA: Diagnosis not present

## 2015-11-15 DIAGNOSIS — R079 Chest pain, unspecified: Secondary | ICD-10-CM | POA: Diagnosis not present

## 2015-11-15 DIAGNOSIS — Z8673 Personal history of transient ischemic attack (TIA), and cerebral infarction without residual deficits: Secondary | ICD-10-CM | POA: Diagnosis not present

## 2015-11-15 DIAGNOSIS — E1140 Type 2 diabetes mellitus with diabetic neuropathy, unspecified: Secondary | ICD-10-CM | POA: Diagnosis not present

## 2015-11-15 DIAGNOSIS — I252 Old myocardial infarction: Secondary | ICD-10-CM | POA: Diagnosis not present

## 2015-11-15 DIAGNOSIS — I4891 Unspecified atrial fibrillation: Secondary | ICD-10-CM | POA: Diagnosis not present

## 2015-11-15 DIAGNOSIS — N189 Chronic kidney disease, unspecified: Secondary | ICD-10-CM | POA: Diagnosis not present

## 2015-11-15 DIAGNOSIS — Z7984 Long term (current) use of oral hypoglycemic drugs: Secondary | ICD-10-CM | POA: Diagnosis not present

## 2015-11-15 DIAGNOSIS — I129 Hypertensive chronic kidney disease with stage 1 through stage 4 chronic kidney disease, or unspecified chronic kidney disease: Secondary | ICD-10-CM | POA: Diagnosis not present

## 2015-11-27 DIAGNOSIS — K5900 Constipation, unspecified: Secondary | ICD-10-CM | POA: Diagnosis not present

## 2015-11-27 DIAGNOSIS — I10 Essential (primary) hypertension: Secondary | ICD-10-CM | POA: Diagnosis not present

## 2015-11-27 DIAGNOSIS — G4700 Insomnia, unspecified: Secondary | ICD-10-CM | POA: Diagnosis not present

## 2015-11-27 DIAGNOSIS — F329 Major depressive disorder, single episode, unspecified: Secondary | ICD-10-CM | POA: Diagnosis not present

## 2015-11-27 DIAGNOSIS — I252 Old myocardial infarction: Secondary | ICD-10-CM | POA: Diagnosis not present

## 2015-11-27 DIAGNOSIS — K219 Gastro-esophageal reflux disease without esophagitis: Secondary | ICD-10-CM | POA: Diagnosis not present

## 2015-11-27 DIAGNOSIS — G40909 Epilepsy, unspecified, not intractable, without status epilepticus: Secondary | ICD-10-CM | POA: Diagnosis not present

## 2015-11-27 DIAGNOSIS — E1142 Type 2 diabetes mellitus with diabetic polyneuropathy: Secondary | ICD-10-CM | POA: Diagnosis not present

## 2015-11-27 DIAGNOSIS — F519 Sleep disorder not due to a substance or known physiological condition, unspecified: Secondary | ICD-10-CM | POA: Diagnosis not present

## 2015-12-04 DIAGNOSIS — E118 Type 2 diabetes mellitus with unspecified complications: Secondary | ICD-10-CM | POA: Diagnosis not present

## 2015-12-04 DIAGNOSIS — N390 Urinary tract infection, site not specified: Secondary | ICD-10-CM | POA: Diagnosis not present

## 2015-12-04 DIAGNOSIS — K5900 Constipation, unspecified: Secondary | ICD-10-CM | POA: Diagnosis not present

## 2015-12-04 DIAGNOSIS — R55 Syncope and collapse: Secondary | ICD-10-CM | POA: Diagnosis not present

## 2015-12-10 DIAGNOSIS — F329 Major depressive disorder, single episode, unspecified: Secondary | ICD-10-CM | POA: Diagnosis not present

## 2015-12-10 DIAGNOSIS — I4891 Unspecified atrial fibrillation: Secondary | ICD-10-CM | POA: Diagnosis not present

## 2015-12-10 DIAGNOSIS — I10 Essential (primary) hypertension: Secondary | ICD-10-CM | POA: Diagnosis not present

## 2015-12-10 DIAGNOSIS — F10129 Alcohol abuse with intoxication, unspecified: Secondary | ICD-10-CM | POA: Diagnosis not present

## 2015-12-10 DIAGNOSIS — E1142 Type 2 diabetes mellitus with diabetic polyneuropathy: Secondary | ICD-10-CM | POA: Diagnosis not present

## 2015-12-10 DIAGNOSIS — T887XXA Unspecified adverse effect of drug or medicament, initial encounter: Secondary | ICD-10-CM | POA: Diagnosis not present

## 2015-12-10 DIAGNOSIS — I252 Old myocardial infarction: Secondary | ICD-10-CM | POA: Diagnosis not present

## 2015-12-10 DIAGNOSIS — K219 Gastro-esophageal reflux disease without esophagitis: Secondary | ICD-10-CM | POA: Diagnosis not present

## 2015-12-10 DIAGNOSIS — T43011A Poisoning by tricyclic antidepressants, accidental (unintentional), initial encounter: Secondary | ICD-10-CM | POA: Diagnosis not present

## 2015-12-10 DIAGNOSIS — R1110 Vomiting, unspecified: Secondary | ICD-10-CM | POA: Diagnosis not present

## 2015-12-10 DIAGNOSIS — F419 Anxiety disorder, unspecified: Secondary | ICD-10-CM | POA: Diagnosis not present

## 2015-12-10 DIAGNOSIS — Y92009 Unspecified place in unspecified non-institutional (private) residence as the place of occurrence of the external cause: Secondary | ICD-10-CM | POA: Diagnosis not present

## 2015-12-10 DIAGNOSIS — F17210 Nicotine dependence, cigarettes, uncomplicated: Secondary | ICD-10-CM | POA: Diagnosis not present

## 2015-12-18 DIAGNOSIS — G4089 Other seizures: Secondary | ICD-10-CM | POA: Diagnosis not present

## 2015-12-18 DIAGNOSIS — B370 Candidal stomatitis: Secondary | ICD-10-CM | POA: Diagnosis not present

## 2015-12-18 DIAGNOSIS — R442 Other hallucinations: Secondary | ICD-10-CM | POA: Diagnosis not present

## 2015-12-18 DIAGNOSIS — R531 Weakness: Secondary | ICD-10-CM | POA: Diagnosis not present

## 2015-12-24 DIAGNOSIS — E11649 Type 2 diabetes mellitus with hypoglycemia without coma: Secondary | ICD-10-CM | POA: Diagnosis not present

## 2015-12-24 DIAGNOSIS — F329 Major depressive disorder, single episode, unspecified: Secondary | ICD-10-CM | POA: Diagnosis not present

## 2015-12-24 DIAGNOSIS — I10 Essential (primary) hypertension: Secondary | ICD-10-CM | POA: Diagnosis not present

## 2015-12-24 DIAGNOSIS — G40909 Epilepsy, unspecified, not intractable, without status epilepticus: Secondary | ICD-10-CM | POA: Diagnosis not present

## 2015-12-24 DIAGNOSIS — R42 Dizziness and giddiness: Secondary | ICD-10-CM | POA: Diagnosis not present

## 2015-12-24 DIAGNOSIS — E1142 Type 2 diabetes mellitus with diabetic polyneuropathy: Secondary | ICD-10-CM | POA: Diagnosis not present

## 2015-12-24 DIAGNOSIS — Z7984 Long term (current) use of oral hypoglycemic drugs: Secondary | ICD-10-CM | POA: Diagnosis not present

## 2015-12-24 DIAGNOSIS — F419 Anxiety disorder, unspecified: Secondary | ICD-10-CM | POA: Diagnosis not present

## 2015-12-24 DIAGNOSIS — I4891 Unspecified atrial fibrillation: Secondary | ICD-10-CM | POA: Diagnosis not present

## 2015-12-24 DIAGNOSIS — I252 Old myocardial infarction: Secondary | ICD-10-CM | POA: Diagnosis not present

## 2015-12-26 DIAGNOSIS — I4891 Unspecified atrial fibrillation: Secondary | ICD-10-CM | POA: Diagnosis not present

## 2015-12-26 DIAGNOSIS — E1142 Type 2 diabetes mellitus with diabetic polyneuropathy: Secondary | ICD-10-CM | POA: Diagnosis not present

## 2015-12-26 DIAGNOSIS — K219 Gastro-esophageal reflux disease without esophagitis: Secondary | ICD-10-CM | POA: Diagnosis not present

## 2015-12-26 DIAGNOSIS — E119 Type 2 diabetes mellitus without complications: Secondary | ICD-10-CM | POA: Diagnosis not present

## 2015-12-26 DIAGNOSIS — R109 Unspecified abdominal pain: Secondary | ICD-10-CM | POA: Diagnosis not present

## 2015-12-26 DIAGNOSIS — I252 Old myocardial infarction: Secondary | ICD-10-CM | POA: Diagnosis not present

## 2015-12-26 DIAGNOSIS — F329 Major depressive disorder, single episode, unspecified: Secondary | ICD-10-CM | POA: Diagnosis not present

## 2015-12-26 DIAGNOSIS — K2970 Gastritis, unspecified, without bleeding: Secondary | ICD-10-CM | POA: Diagnosis not present

## 2015-12-26 DIAGNOSIS — F419 Anxiety disorder, unspecified: Secondary | ICD-10-CM | POA: Diagnosis not present

## 2015-12-26 DIAGNOSIS — I10 Essential (primary) hypertension: Secondary | ICD-10-CM | POA: Diagnosis not present

## 2015-12-26 DIAGNOSIS — R112 Nausea with vomiting, unspecified: Secondary | ICD-10-CM | POA: Diagnosis not present

## 2016-01-20 DIAGNOSIS — N184 Chronic kidney disease, stage 4 (severe): Secondary | ICD-10-CM | POA: Diagnosis not present

## 2016-01-20 DIAGNOSIS — I129 Hypertensive chronic kidney disease with stage 1 through stage 4 chronic kidney disease, or unspecified chronic kidney disease: Secondary | ICD-10-CM | POA: Diagnosis not present

## 2016-01-20 DIAGNOSIS — N390 Urinary tract infection, site not specified: Secondary | ICD-10-CM | POA: Diagnosis not present

## 2016-01-20 DIAGNOSIS — R531 Weakness: Secondary | ICD-10-CM | POA: Diagnosis not present

## 2016-01-20 DIAGNOSIS — I10 Essential (primary) hypertension: Secondary | ICD-10-CM | POA: Diagnosis not present

## 2016-01-20 DIAGNOSIS — E118 Type 2 diabetes mellitus with unspecified complications: Secondary | ICD-10-CM | POA: Diagnosis not present

## 2016-02-03 DIAGNOSIS — E531 Pyridoxine deficiency: Secondary | ICD-10-CM | POA: Diagnosis not present

## 2016-02-03 DIAGNOSIS — N390 Urinary tract infection, site not specified: Secondary | ICD-10-CM | POA: Diagnosis not present

## 2016-02-03 DIAGNOSIS — N184 Chronic kidney disease, stage 4 (severe): Secondary | ICD-10-CM | POA: Diagnosis not present

## 2016-02-03 DIAGNOSIS — R110 Nausea: Secondary | ICD-10-CM | POA: Diagnosis not present

## 2016-03-11 DIAGNOSIS — N184 Chronic kidney disease, stage 4 (severe): Secondary | ICD-10-CM | POA: Diagnosis not present

## 2016-03-11 DIAGNOSIS — E1121 Type 2 diabetes mellitus with diabetic nephropathy: Secondary | ICD-10-CM | POA: Diagnosis not present

## 2016-03-11 DIAGNOSIS — E1142 Type 2 diabetes mellitus with diabetic polyneuropathy: Secondary | ICD-10-CM | POA: Diagnosis not present

## 2016-03-11 DIAGNOSIS — Z79899 Other long term (current) drug therapy: Secondary | ICD-10-CM | POA: Diagnosis not present

## 2016-03-11 DIAGNOSIS — Z6824 Body mass index (BMI) 24.0-24.9, adult: Secondary | ICD-10-CM | POA: Diagnosis not present

## 2016-04-27 DIAGNOSIS — F4321 Adjustment disorder with depressed mood: Secondary | ICD-10-CM | POA: Diagnosis not present

## 2016-04-27 DIAGNOSIS — F1910 Other psychoactive substance abuse, uncomplicated: Secondary | ICD-10-CM | POA: Diagnosis not present

## 2016-04-27 DIAGNOSIS — F10129 Alcohol abuse with intoxication, unspecified: Secondary | ICD-10-CM | POA: Diagnosis not present

## 2016-04-27 DIAGNOSIS — F17210 Nicotine dependence, cigarettes, uncomplicated: Secondary | ICD-10-CM | POA: Diagnosis not present

## 2016-04-27 DIAGNOSIS — T43012A Poisoning by tricyclic antidepressants, intentional self-harm, initial encounter: Secondary | ICD-10-CM | POA: Diagnosis not present

## 2016-04-27 DIAGNOSIS — Z90710 Acquired absence of both cervix and uterus: Secondary | ICD-10-CM | POA: Diagnosis not present

## 2016-04-27 DIAGNOSIS — Z7902 Long term (current) use of antithrombotics/antiplatelets: Secondary | ICD-10-CM | POA: Diagnosis not present

## 2016-04-27 DIAGNOSIS — I4891 Unspecified atrial fibrillation: Secondary | ICD-10-CM | POA: Diagnosis not present

## 2016-04-27 DIAGNOSIS — F419 Anxiety disorder, unspecified: Secondary | ICD-10-CM | POA: Diagnosis not present

## 2016-04-27 DIAGNOSIS — Z8673 Personal history of transient ischemic attack (TIA), and cerebral infarction without residual deficits: Secondary | ICD-10-CM | POA: Diagnosis not present

## 2016-04-27 DIAGNOSIS — E1142 Type 2 diabetes mellitus with diabetic polyneuropathy: Secondary | ICD-10-CM | POA: Diagnosis not present

## 2016-04-27 DIAGNOSIS — Z79899 Other long term (current) drug therapy: Secondary | ICD-10-CM | POA: Diagnosis not present

## 2016-04-27 DIAGNOSIS — T887XXA Unspecified adverse effect of drug or medicament, initial encounter: Secondary | ICD-10-CM | POA: Diagnosis not present

## 2016-04-28 DIAGNOSIS — I129 Hypertensive chronic kidney disease with stage 1 through stage 4 chronic kidney disease, or unspecified chronic kidney disease: Secondary | ICD-10-CM | POA: Diagnosis not present

## 2016-04-28 DIAGNOSIS — F4321 Adjustment disorder with depressed mood: Secondary | ICD-10-CM | POA: Diagnosis not present

## 2016-04-28 DIAGNOSIS — I4891 Unspecified atrial fibrillation: Secondary | ICD-10-CM | POA: Diagnosis not present

## 2016-04-28 DIAGNOSIS — E1142 Type 2 diabetes mellitus with diabetic polyneuropathy: Secondary | ICD-10-CM | POA: Diagnosis not present

## 2016-04-28 DIAGNOSIS — T43012A Poisoning by tricyclic antidepressants, intentional self-harm, initial encounter: Secondary | ICD-10-CM | POA: Diagnosis not present

## 2016-04-28 DIAGNOSIS — N189 Chronic kidney disease, unspecified: Secondary | ICD-10-CM | POA: Diagnosis not present

## 2016-04-28 DIAGNOSIS — Z743 Need for continuous supervision: Secondary | ICD-10-CM | POA: Diagnosis not present

## 2016-04-28 DIAGNOSIS — E1122 Type 2 diabetes mellitus with diabetic chronic kidney disease: Secondary | ICD-10-CM | POA: Diagnosis not present

## 2016-04-28 DIAGNOSIS — F329 Major depressive disorder, single episode, unspecified: Secondary | ICD-10-CM | POA: Diagnosis not present

## 2016-04-28 DIAGNOSIS — R279 Unspecified lack of coordination: Secondary | ICD-10-CM | POA: Diagnosis not present

## 2016-04-28 DIAGNOSIS — F419 Anxiety disorder, unspecified: Secondary | ICD-10-CM | POA: Diagnosis not present

## 2016-04-29 DIAGNOSIS — F4321 Adjustment disorder with depressed mood: Secondary | ICD-10-CM | POA: Diagnosis not present

## 2016-06-03 DIAGNOSIS — E1121 Type 2 diabetes mellitus with diabetic nephropathy: Secondary | ICD-10-CM | POA: Diagnosis not present

## 2016-06-09 DIAGNOSIS — N183 Chronic kidney disease, stage 3 (moderate): Secondary | ICD-10-CM | POA: Diagnosis not present

## 2016-06-09 DIAGNOSIS — E1121 Type 2 diabetes mellitus with diabetic nephropathy: Secondary | ICD-10-CM | POA: Diagnosis not present

## 2016-06-09 DIAGNOSIS — I129 Hypertensive chronic kidney disease with stage 1 through stage 4 chronic kidney disease, or unspecified chronic kidney disease: Secondary | ICD-10-CM | POA: Diagnosis not present

## 2016-06-09 DIAGNOSIS — E1165 Type 2 diabetes mellitus with hyperglycemia: Secondary | ICD-10-CM | POA: Diagnosis not present

## 2016-06-14 DIAGNOSIS — M545 Low back pain: Secondary | ICD-10-CM | POA: Diagnosis not present

## 2016-06-14 DIAGNOSIS — E785 Hyperlipidemia, unspecified: Secondary | ICD-10-CM | POA: Diagnosis not present

## 2016-06-14 DIAGNOSIS — I10 Essential (primary) hypertension: Secondary | ICD-10-CM | POA: Diagnosis not present

## 2016-06-14 DIAGNOSIS — E1121 Type 2 diabetes mellitus with diabetic nephropathy: Secondary | ICD-10-CM | POA: Diagnosis not present

## 2016-08-31 DIAGNOSIS — I10 Essential (primary) hypertension: Secondary | ICD-10-CM | POA: Diagnosis not present

## 2016-08-31 DIAGNOSIS — N183 Chronic kidney disease, stage 3 (moderate): Secondary | ICD-10-CM | POA: Diagnosis not present

## 2016-08-31 DIAGNOSIS — E782 Mixed hyperlipidemia: Secondary | ICD-10-CM | POA: Diagnosis not present

## 2016-08-31 DIAGNOSIS — E119 Type 2 diabetes mellitus without complications: Secondary | ICD-10-CM | POA: Diagnosis not present

## 2016-08-31 DIAGNOSIS — Z23 Encounter for immunization: Secondary | ICD-10-CM | POA: Diagnosis not present

## 2016-08-31 DIAGNOSIS — N3946 Mixed incontinence: Secondary | ICD-10-CM | POA: Diagnosis not present

## 2016-08-31 DIAGNOSIS — F439 Reaction to severe stress, unspecified: Secondary | ICD-10-CM | POA: Diagnosis not present

## 2016-11-14 DIAGNOSIS — F419 Anxiety disorder, unspecified: Secondary | ICD-10-CM | POA: Diagnosis not present

## 2016-11-14 DIAGNOSIS — E119 Type 2 diabetes mellitus without complications: Secondary | ICD-10-CM | POA: Diagnosis not present

## 2016-11-14 IMAGING — CT CT HEAD WITHOUT CONTRAST
3 of 4 series · 17 of 30 positions shown, 19 images · non-contrast
Comparison: 11/20/2014

CLINICAL DATA: Syncopal episode.  Passed out at home in yd.

EXAM:
CT HEAD WITHOUT CONTRAST
CT CERVICAL SPINE WITHOUT CONTRAST
TECHNIQUE: Multidetector CT imaging of the head and cervical spine was
performed following the standard protocol without intravenous
contrast. Multiplanar CT image reconstructions of the cervical spine
were also generated.

[Series 3: head bone · axial · 0.39mm/px · z∈[+271,+379]mm · 7 of 96 slices shown]
[im 12/96  bone]
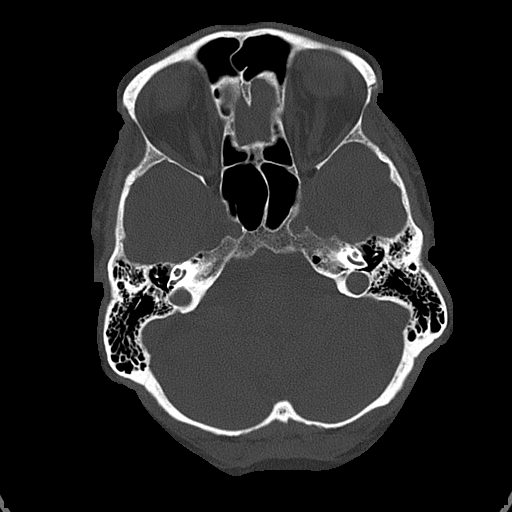
[im 24/96  bone]
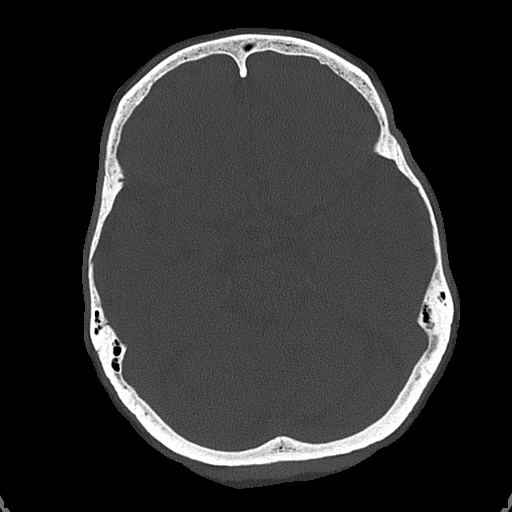
[im 36/96  bone]
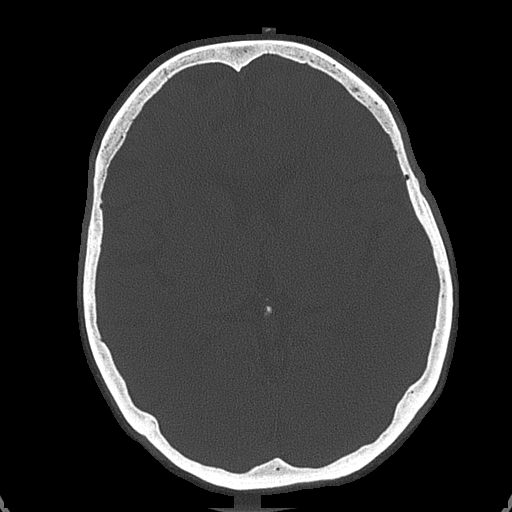
[im 48/96  bone]
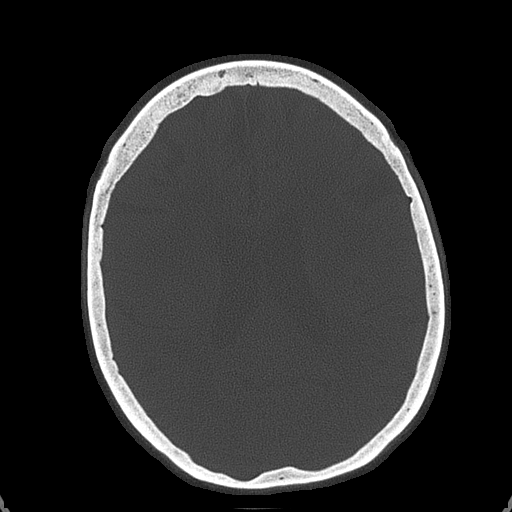
[im 60/96  bone]
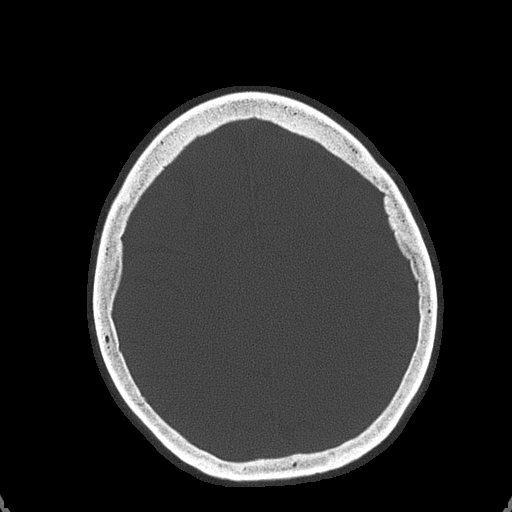
[im 72/96  bone]
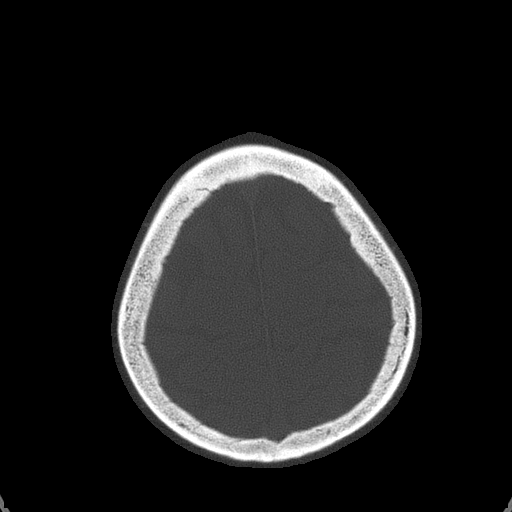
[im 84/96  bone]
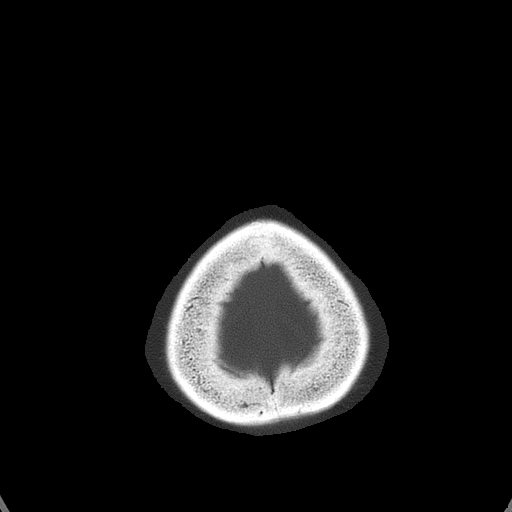

[Series 5: c spine soft · axial · 0.39mm/px · z∈[+115,+137]mm · 2 of 78 slices shown]
[im 12/78  brain]
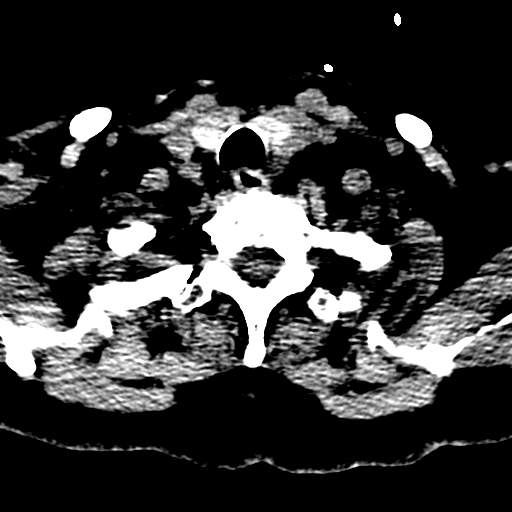
[im 23/78  brain]
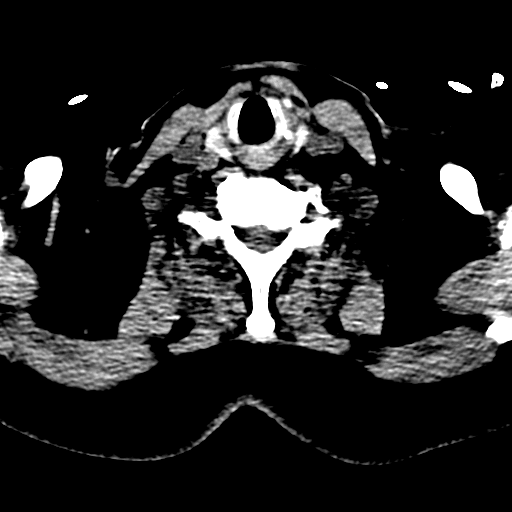

[Series 10: orthogonal axials · axial · 0.29mm/px · z∈[+72,+215]mm · 8 of 102 slices shown, 10 images]
[im 12/102  brain]
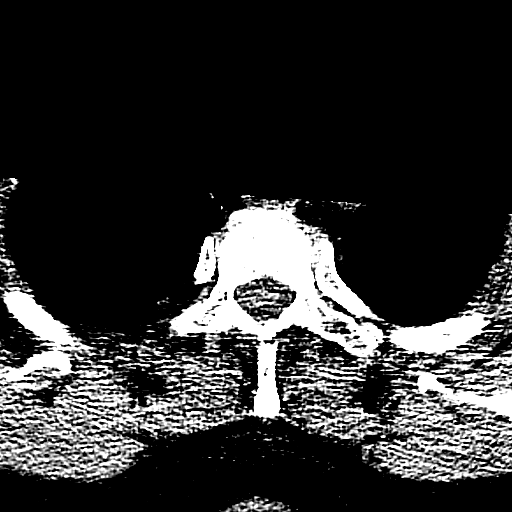
[im 12/102  bone]
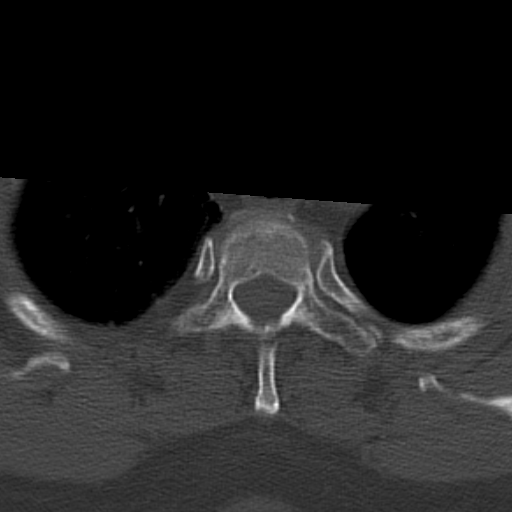
[im 23/102  brain]
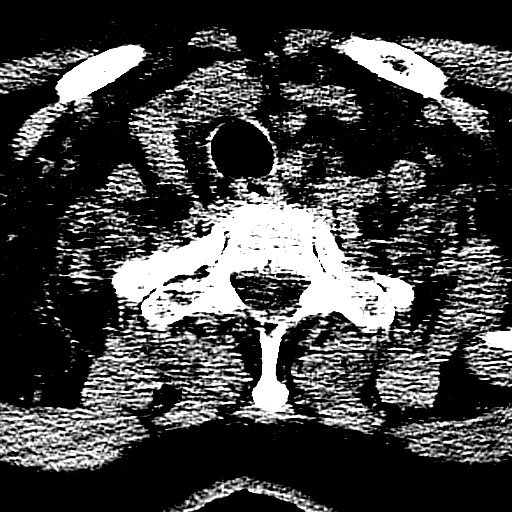
[im 34/102  brain]
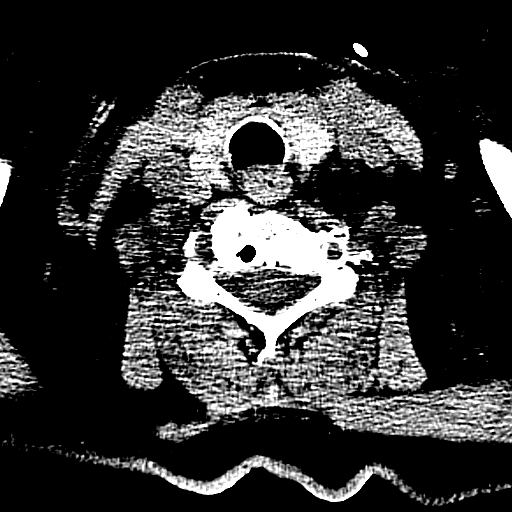
[im 45/102  brain]
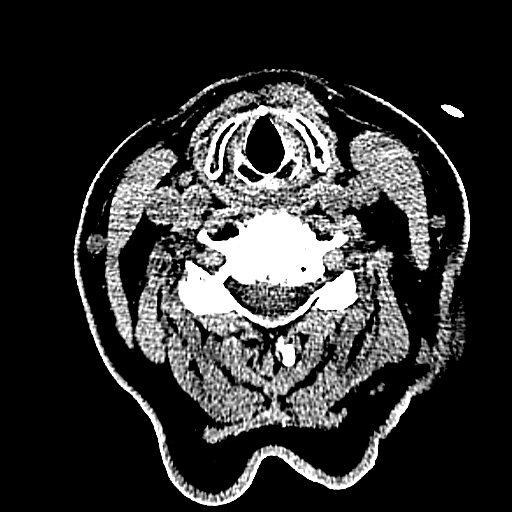
[im 57/102  brain]
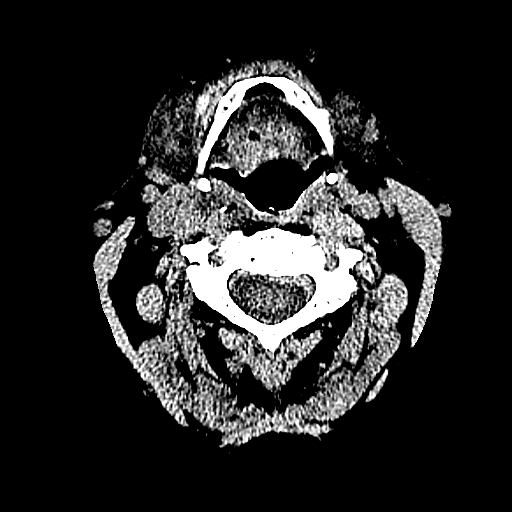
[im 57/102  bone]
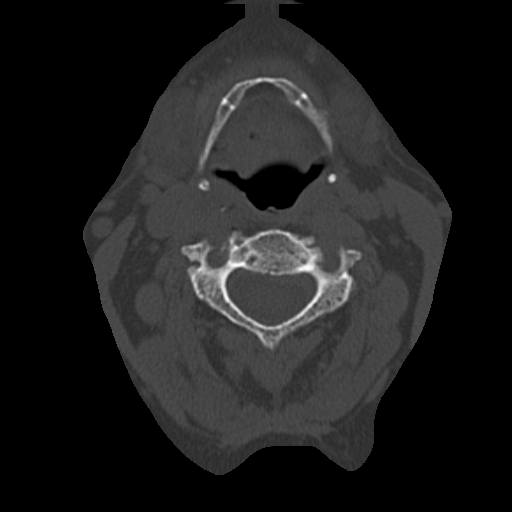
[im 68/102  brain]
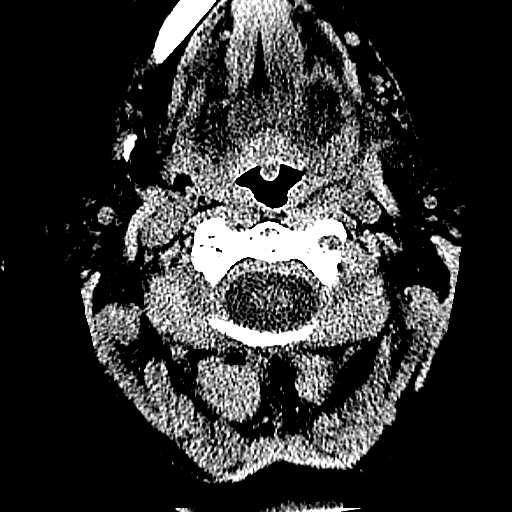
[im 79/102  brain]
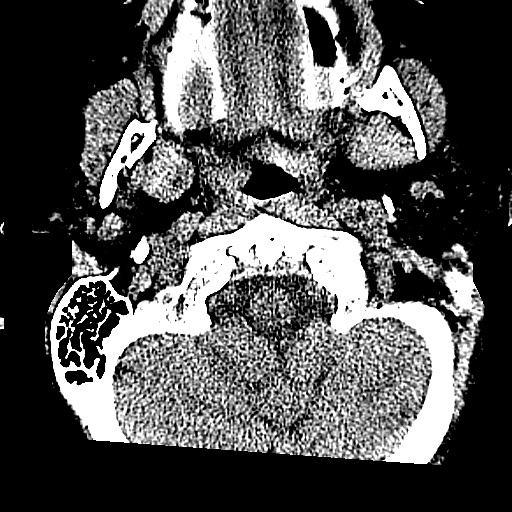
[im 90/102  brain]
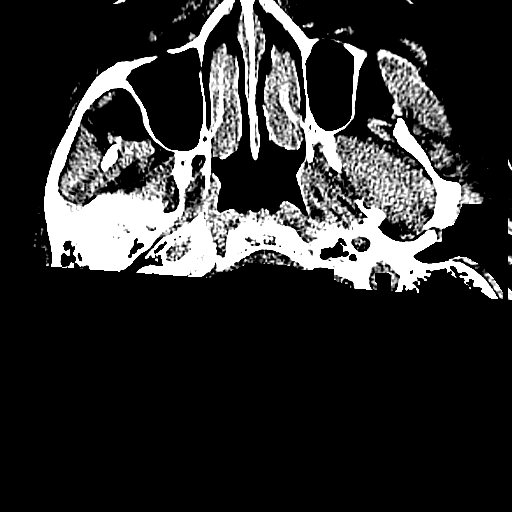

[17 of 30 positions shown; findings below may reference images not displayed]

FINDINGS: CT HEAD FINDINGS

There is diffuse low attenuation throughout the subcortical and
periventricular white matter compatible with chronic microvascular
disease. Chronic lacunar infarct is identified adjacent to the
anterior horn of the right lateral ventricle, image 15/series 2.
Chronic bilateral lacunar infarcts are noted within the thalamus,
image 13/ series 2. No evidence for acute intracranial hemorrhage,
cortical infarct or mass. The paranasal sinuses are clear. The
mastoid air cells are clear. The calvarium appears intact.

CT CERVICAL SPINE FINDINGS

Straightening of normal cervical lordosis. The vertebral body
heights are well preserved. The facet joints are well aligned. There
is multi level disc space narrowing and ventral endplate spurring
identified. There is no fracture or subluxation identified.
IMPRESSION: 1. No acute intracranial abnormalities.
2. Small vessel ischemic change.
3. Cervical spondylosis.
4. No evidence for cervical spine fracture.

## 2016-12-05 DIAGNOSIS — R0789 Other chest pain: Secondary | ICD-10-CM | POA: Diagnosis not present

## 2016-12-05 DIAGNOSIS — Z8673 Personal history of transient ischemic attack (TIA), and cerebral infarction without residual deficits: Secondary | ICD-10-CM | POA: Diagnosis not present

## 2016-12-05 DIAGNOSIS — F17210 Nicotine dependence, cigarettes, uncomplicated: Secondary | ICD-10-CM | POA: Diagnosis not present

## 2016-12-05 DIAGNOSIS — Z90710 Acquired absence of both cervix and uterus: Secondary | ICD-10-CM | POA: Diagnosis not present

## 2016-12-05 DIAGNOSIS — E1140 Type 2 diabetes mellitus with diabetic neuropathy, unspecified: Secondary | ICD-10-CM | POA: Diagnosis not present

## 2016-12-05 DIAGNOSIS — R079 Chest pain, unspecified: Secondary | ICD-10-CM | POA: Diagnosis not present

## 2016-12-05 DIAGNOSIS — I252 Old myocardial infarction: Secondary | ICD-10-CM | POA: Diagnosis not present

## 2016-12-05 DIAGNOSIS — N189 Chronic kidney disease, unspecified: Secondary | ICD-10-CM | POA: Diagnosis not present

## 2016-12-05 DIAGNOSIS — I4891 Unspecified atrial fibrillation: Secondary | ICD-10-CM | POA: Diagnosis not present

## 2016-12-05 DIAGNOSIS — E1122 Type 2 diabetes mellitus with diabetic chronic kidney disease: Secondary | ICD-10-CM | POA: Diagnosis not present

## 2016-12-12 DIAGNOSIS — R0600 Dyspnea, unspecified: Secondary | ICD-10-CM | POA: Diagnosis not present

## 2016-12-12 DIAGNOSIS — R109 Unspecified abdominal pain: Secondary | ICD-10-CM | POA: Diagnosis not present

## 2016-12-13 DIAGNOSIS — E1122 Type 2 diabetes mellitus with diabetic chronic kidney disease: Secondary | ICD-10-CM | POA: Diagnosis not present

## 2016-12-13 DIAGNOSIS — F329 Major depressive disorder, single episode, unspecified: Secondary | ICD-10-CM | POA: Diagnosis not present

## 2016-12-13 DIAGNOSIS — I4891 Unspecified atrial fibrillation: Secondary | ICD-10-CM | POA: Diagnosis not present

## 2016-12-13 DIAGNOSIS — Z90710 Acquired absence of both cervix and uterus: Secondary | ICD-10-CM | POA: Diagnosis not present

## 2016-12-13 DIAGNOSIS — E1143 Type 2 diabetes mellitus with diabetic autonomic (poly)neuropathy: Secondary | ICD-10-CM | POA: Diagnosis not present

## 2016-12-13 DIAGNOSIS — E1140 Type 2 diabetes mellitus with diabetic neuropathy, unspecified: Secondary | ICD-10-CM | POA: Diagnosis not present

## 2016-12-13 DIAGNOSIS — N189 Chronic kidney disease, unspecified: Secondary | ICD-10-CM | POA: Diagnosis not present

## 2016-12-13 DIAGNOSIS — F17210 Nicotine dependence, cigarettes, uncomplicated: Secondary | ICD-10-CM | POA: Diagnosis not present

## 2016-12-13 DIAGNOSIS — I252 Old myocardial infarction: Secondary | ICD-10-CM | POA: Diagnosis not present

## 2016-12-13 DIAGNOSIS — N390 Urinary tract infection, site not specified: Secondary | ICD-10-CM | POA: Diagnosis not present

## 2016-12-21 DIAGNOSIS — I951 Orthostatic hypotension: Secondary | ICD-10-CM | POA: Diagnosis not present

## 2016-12-21 DIAGNOSIS — R531 Weakness: Secondary | ICD-10-CM | POA: Diagnosis not present

## 2016-12-21 DIAGNOSIS — I4891 Unspecified atrial fibrillation: Secondary | ICD-10-CM | POA: Diagnosis not present

## 2016-12-21 DIAGNOSIS — R2689 Other abnormalities of gait and mobility: Secondary | ICD-10-CM | POA: Diagnosis not present

## 2016-12-21 DIAGNOSIS — I2510 Atherosclerotic heart disease of native coronary artery without angina pectoris: Secondary | ICD-10-CM | POA: Diagnosis not present

## 2016-12-21 DIAGNOSIS — R270 Ataxia, unspecified: Secondary | ICD-10-CM | POA: Diagnosis not present

## 2016-12-21 DIAGNOSIS — E1122 Type 2 diabetes mellitus with diabetic chronic kidney disease: Secondary | ICD-10-CM | POA: Diagnosis not present

## 2016-12-21 DIAGNOSIS — I129 Hypertensive chronic kidney disease with stage 1 through stage 4 chronic kidney disease, or unspecified chronic kidney disease: Secondary | ICD-10-CM | POA: Diagnosis not present

## 2016-12-21 DIAGNOSIS — F329 Major depressive disorder, single episode, unspecified: Secondary | ICD-10-CM | POA: Diagnosis not present

## 2016-12-23 DIAGNOSIS — E1122 Type 2 diabetes mellitus with diabetic chronic kidney disease: Secondary | ICD-10-CM | POA: Diagnosis not present

## 2016-12-23 DIAGNOSIS — F329 Major depressive disorder, single episode, unspecified: Secondary | ICD-10-CM | POA: Diagnosis not present

## 2016-12-23 DIAGNOSIS — R269 Unspecified abnormalities of gait and mobility: Secondary | ICD-10-CM | POA: Diagnosis not present

## 2016-12-23 DIAGNOSIS — I2510 Atherosclerotic heart disease of native coronary artery without angina pectoris: Secondary | ICD-10-CM | POA: Diagnosis not present

## 2016-12-23 DIAGNOSIS — I951 Orthostatic hypotension: Secondary | ICD-10-CM | POA: Diagnosis not present

## 2016-12-23 DIAGNOSIS — I129 Hypertensive chronic kidney disease with stage 1 through stage 4 chronic kidney disease, or unspecified chronic kidney disease: Secondary | ICD-10-CM | POA: Diagnosis not present

## 2016-12-23 DIAGNOSIS — I4891 Unspecified atrial fibrillation: Secondary | ICD-10-CM | POA: Diagnosis not present

## 2016-12-23 DIAGNOSIS — N189 Chronic kidney disease, unspecified: Secondary | ICD-10-CM | POA: Diagnosis not present

## 2016-12-23 DIAGNOSIS — R531 Weakness: Secondary | ICD-10-CM | POA: Diagnosis not present

## 2016-12-24 DIAGNOSIS — R531 Weakness: Secondary | ICD-10-CM | POA: Diagnosis not present

## 2016-12-24 DIAGNOSIS — I2510 Atherosclerotic heart disease of native coronary artery without angina pectoris: Secondary | ICD-10-CM | POA: Diagnosis not present

## 2016-12-24 DIAGNOSIS — N189 Chronic kidney disease, unspecified: Secondary | ICD-10-CM | POA: Diagnosis not present

## 2016-12-24 DIAGNOSIS — I951 Orthostatic hypotension: Secondary | ICD-10-CM | POA: Diagnosis not present

## 2016-12-24 DIAGNOSIS — F329 Major depressive disorder, single episode, unspecified: Secondary | ICD-10-CM | POA: Diagnosis not present

## 2016-12-24 DIAGNOSIS — E1122 Type 2 diabetes mellitus with diabetic chronic kidney disease: Secondary | ICD-10-CM | POA: Diagnosis not present

## 2016-12-24 DIAGNOSIS — R269 Unspecified abnormalities of gait and mobility: Secondary | ICD-10-CM | POA: Diagnosis not present

## 2016-12-24 DIAGNOSIS — I129 Hypertensive chronic kidney disease with stage 1 through stage 4 chronic kidney disease, or unspecified chronic kidney disease: Secondary | ICD-10-CM | POA: Diagnosis not present

## 2016-12-24 DIAGNOSIS — I4891 Unspecified atrial fibrillation: Secondary | ICD-10-CM | POA: Diagnosis not present

## 2016-12-25 DIAGNOSIS — R269 Unspecified abnormalities of gait and mobility: Secondary | ICD-10-CM | POA: Diagnosis not present

## 2016-12-25 DIAGNOSIS — N189 Chronic kidney disease, unspecified: Secondary | ICD-10-CM | POA: Diagnosis not present

## 2016-12-25 DIAGNOSIS — F329 Major depressive disorder, single episode, unspecified: Secondary | ICD-10-CM | POA: Diagnosis not present

## 2016-12-25 DIAGNOSIS — I4891 Unspecified atrial fibrillation: Secondary | ICD-10-CM | POA: Diagnosis not present

## 2016-12-25 DIAGNOSIS — E1122 Type 2 diabetes mellitus with diabetic chronic kidney disease: Secondary | ICD-10-CM | POA: Diagnosis not present

## 2016-12-25 DIAGNOSIS — I951 Orthostatic hypotension: Secondary | ICD-10-CM | POA: Diagnosis not present

## 2016-12-25 DIAGNOSIS — R531 Weakness: Secondary | ICD-10-CM | POA: Diagnosis not present

## 2016-12-25 DIAGNOSIS — I2510 Atherosclerotic heart disease of native coronary artery without angina pectoris: Secondary | ICD-10-CM | POA: Diagnosis not present

## 2016-12-25 DIAGNOSIS — I129 Hypertensive chronic kidney disease with stage 1 through stage 4 chronic kidney disease, or unspecified chronic kidney disease: Secondary | ICD-10-CM | POA: Diagnosis not present

## 2016-12-26 DIAGNOSIS — N189 Chronic kidney disease, unspecified: Secondary | ICD-10-CM | POA: Diagnosis not present

## 2016-12-26 DIAGNOSIS — I2510 Atherosclerotic heart disease of native coronary artery without angina pectoris: Secondary | ICD-10-CM | POA: Diagnosis not present

## 2016-12-26 DIAGNOSIS — R531 Weakness: Secondary | ICD-10-CM | POA: Diagnosis not present

## 2016-12-26 DIAGNOSIS — E1122 Type 2 diabetes mellitus with diabetic chronic kidney disease: Secondary | ICD-10-CM | POA: Diagnosis not present

## 2016-12-26 DIAGNOSIS — R269 Unspecified abnormalities of gait and mobility: Secondary | ICD-10-CM | POA: Diagnosis not present

## 2016-12-26 DIAGNOSIS — I951 Orthostatic hypotension: Secondary | ICD-10-CM | POA: Diagnosis not present

## 2016-12-26 DIAGNOSIS — F329 Major depressive disorder, single episode, unspecified: Secondary | ICD-10-CM | POA: Diagnosis not present

## 2016-12-26 DIAGNOSIS — I129 Hypertensive chronic kidney disease with stage 1 through stage 4 chronic kidney disease, or unspecified chronic kidney disease: Secondary | ICD-10-CM | POA: Diagnosis not present

## 2016-12-26 DIAGNOSIS — I4891 Unspecified atrial fibrillation: Secondary | ICD-10-CM | POA: Diagnosis not present

## 2016-12-27 DIAGNOSIS — R531 Weakness: Secondary | ICD-10-CM | POA: Diagnosis not present

## 2016-12-27 DIAGNOSIS — I129 Hypertensive chronic kidney disease with stage 1 through stage 4 chronic kidney disease, or unspecified chronic kidney disease: Secondary | ICD-10-CM | POA: Diagnosis not present

## 2016-12-27 DIAGNOSIS — F329 Major depressive disorder, single episode, unspecified: Secondary | ICD-10-CM | POA: Diagnosis not present

## 2016-12-27 DIAGNOSIS — R269 Unspecified abnormalities of gait and mobility: Secondary | ICD-10-CM | POA: Diagnosis not present

## 2016-12-27 DIAGNOSIS — E1122 Type 2 diabetes mellitus with diabetic chronic kidney disease: Secondary | ICD-10-CM | POA: Diagnosis not present

## 2016-12-27 DIAGNOSIS — I951 Orthostatic hypotension: Secondary | ICD-10-CM | POA: Diagnosis not present

## 2016-12-27 DIAGNOSIS — I2510 Atherosclerotic heart disease of native coronary artery without angina pectoris: Secondary | ICD-10-CM | POA: Diagnosis not present

## 2016-12-27 DIAGNOSIS — N189 Chronic kidney disease, unspecified: Secondary | ICD-10-CM | POA: Diagnosis not present

## 2016-12-27 DIAGNOSIS — I4891 Unspecified atrial fibrillation: Secondary | ICD-10-CM | POA: Diagnosis not present

## 2016-12-28 DIAGNOSIS — E1122 Type 2 diabetes mellitus with diabetic chronic kidney disease: Secondary | ICD-10-CM | POA: Diagnosis not present

## 2016-12-28 DIAGNOSIS — N189 Chronic kidney disease, unspecified: Secondary | ICD-10-CM | POA: Diagnosis not present

## 2016-12-28 DIAGNOSIS — I4891 Unspecified atrial fibrillation: Secondary | ICD-10-CM | POA: Diagnosis not present

## 2016-12-28 DIAGNOSIS — I129 Hypertensive chronic kidney disease with stage 1 through stage 4 chronic kidney disease, or unspecified chronic kidney disease: Secondary | ICD-10-CM | POA: Diagnosis not present

## 2016-12-28 DIAGNOSIS — R269 Unspecified abnormalities of gait and mobility: Secondary | ICD-10-CM | POA: Diagnosis not present

## 2016-12-28 DIAGNOSIS — R531 Weakness: Secondary | ICD-10-CM | POA: Diagnosis not present

## 2016-12-28 DIAGNOSIS — F329 Major depressive disorder, single episode, unspecified: Secondary | ICD-10-CM | POA: Diagnosis not present

## 2016-12-28 DIAGNOSIS — I2510 Atherosclerotic heart disease of native coronary artery without angina pectoris: Secondary | ICD-10-CM | POA: Diagnosis not present

## 2016-12-28 DIAGNOSIS — I951 Orthostatic hypotension: Secondary | ICD-10-CM | POA: Diagnosis not present

## 2016-12-31 DIAGNOSIS — I2510 Atherosclerotic heart disease of native coronary artery without angina pectoris: Secondary | ICD-10-CM | POA: Diagnosis not present

## 2016-12-31 DIAGNOSIS — R531 Weakness: Secondary | ICD-10-CM | POA: Diagnosis not present

## 2016-12-31 DIAGNOSIS — I129 Hypertensive chronic kidney disease with stage 1 through stage 4 chronic kidney disease, or unspecified chronic kidney disease: Secondary | ICD-10-CM | POA: Diagnosis not present

## 2016-12-31 DIAGNOSIS — E1122 Type 2 diabetes mellitus with diabetic chronic kidney disease: Secondary | ICD-10-CM | POA: Diagnosis not present

## 2016-12-31 DIAGNOSIS — I4891 Unspecified atrial fibrillation: Secondary | ICD-10-CM | POA: Diagnosis not present

## 2016-12-31 DIAGNOSIS — N189 Chronic kidney disease, unspecified: Secondary | ICD-10-CM | POA: Diagnosis not present

## 2017-01-02 DIAGNOSIS — N189 Chronic kidney disease, unspecified: Secondary | ICD-10-CM | POA: Diagnosis not present

## 2017-01-02 DIAGNOSIS — E1122 Type 2 diabetes mellitus with diabetic chronic kidney disease: Secondary | ICD-10-CM | POA: Diagnosis not present

## 2017-01-02 DIAGNOSIS — I129 Hypertensive chronic kidney disease with stage 1 through stage 4 chronic kidney disease, or unspecified chronic kidney disease: Secondary | ICD-10-CM | POA: Diagnosis not present

## 2017-01-02 DIAGNOSIS — I4891 Unspecified atrial fibrillation: Secondary | ICD-10-CM | POA: Diagnosis not present

## 2017-01-02 DIAGNOSIS — I2510 Atherosclerotic heart disease of native coronary artery without angina pectoris: Secondary | ICD-10-CM | POA: Diagnosis not present

## 2017-01-02 DIAGNOSIS — R531 Weakness: Secondary | ICD-10-CM | POA: Diagnosis not present

## 2017-01-03 DIAGNOSIS — I4891 Unspecified atrial fibrillation: Secondary | ICD-10-CM | POA: Diagnosis not present

## 2017-01-03 DIAGNOSIS — E1122 Type 2 diabetes mellitus with diabetic chronic kidney disease: Secondary | ICD-10-CM | POA: Diagnosis not present

## 2017-01-03 DIAGNOSIS — R531 Weakness: Secondary | ICD-10-CM | POA: Diagnosis not present

## 2017-01-03 DIAGNOSIS — N189 Chronic kidney disease, unspecified: Secondary | ICD-10-CM | POA: Diagnosis not present

## 2017-01-03 DIAGNOSIS — I129 Hypertensive chronic kidney disease with stage 1 through stage 4 chronic kidney disease, or unspecified chronic kidney disease: Secondary | ICD-10-CM | POA: Diagnosis not present

## 2017-01-03 DIAGNOSIS — I2510 Atherosclerotic heart disease of native coronary artery without angina pectoris: Secondary | ICD-10-CM | POA: Diagnosis not present

## 2017-01-04 DIAGNOSIS — N189 Chronic kidney disease, unspecified: Secondary | ICD-10-CM | POA: Diagnosis not present

## 2017-01-04 DIAGNOSIS — R531 Weakness: Secondary | ICD-10-CM | POA: Diagnosis not present

## 2017-01-04 DIAGNOSIS — I129 Hypertensive chronic kidney disease with stage 1 through stage 4 chronic kidney disease, or unspecified chronic kidney disease: Secondary | ICD-10-CM | POA: Diagnosis not present

## 2017-01-04 DIAGNOSIS — I2510 Atherosclerotic heart disease of native coronary artery without angina pectoris: Secondary | ICD-10-CM | POA: Diagnosis not present

## 2017-01-04 DIAGNOSIS — E1122 Type 2 diabetes mellitus with diabetic chronic kidney disease: Secondary | ICD-10-CM | POA: Diagnosis not present

## 2017-01-04 DIAGNOSIS — I4891 Unspecified atrial fibrillation: Secondary | ICD-10-CM | POA: Diagnosis not present

## 2017-01-06 DIAGNOSIS — N189 Chronic kidney disease, unspecified: Secondary | ICD-10-CM | POA: Diagnosis not present

## 2017-01-06 DIAGNOSIS — R531 Weakness: Secondary | ICD-10-CM | POA: Diagnosis not present

## 2017-01-06 DIAGNOSIS — I4891 Unspecified atrial fibrillation: Secondary | ICD-10-CM | POA: Diagnosis not present

## 2017-01-06 DIAGNOSIS — I129 Hypertensive chronic kidney disease with stage 1 through stage 4 chronic kidney disease, or unspecified chronic kidney disease: Secondary | ICD-10-CM | POA: Diagnosis not present

## 2017-01-06 DIAGNOSIS — E1122 Type 2 diabetes mellitus with diabetic chronic kidney disease: Secondary | ICD-10-CM | POA: Diagnosis not present

## 2017-01-06 DIAGNOSIS — I2510 Atherosclerotic heart disease of native coronary artery without angina pectoris: Secondary | ICD-10-CM | POA: Diagnosis not present

## 2017-01-10 DIAGNOSIS — I4891 Unspecified atrial fibrillation: Secondary | ICD-10-CM | POA: Diagnosis not present

## 2017-01-10 DIAGNOSIS — I129 Hypertensive chronic kidney disease with stage 1 through stage 4 chronic kidney disease, or unspecified chronic kidney disease: Secondary | ICD-10-CM | POA: Diagnosis not present

## 2017-01-10 DIAGNOSIS — E1122 Type 2 diabetes mellitus with diabetic chronic kidney disease: Secondary | ICD-10-CM | POA: Diagnosis not present

## 2017-01-10 DIAGNOSIS — I2510 Atherosclerotic heart disease of native coronary artery without angina pectoris: Secondary | ICD-10-CM | POA: Diagnosis not present

## 2017-01-10 DIAGNOSIS — N189 Chronic kidney disease, unspecified: Secondary | ICD-10-CM | POA: Diagnosis not present

## 2017-01-10 DIAGNOSIS — R531 Weakness: Secondary | ICD-10-CM | POA: Diagnosis not present

## 2017-01-11 DIAGNOSIS — R748 Abnormal levels of other serum enzymes: Secondary | ICD-10-CM | POA: Diagnosis not present

## 2017-01-11 DIAGNOSIS — R531 Weakness: Secondary | ICD-10-CM | POA: Diagnosis not present

## 2017-01-11 DIAGNOSIS — I4891 Unspecified atrial fibrillation: Secondary | ICD-10-CM | POA: Diagnosis not present

## 2017-01-11 DIAGNOSIS — E1122 Type 2 diabetes mellitus with diabetic chronic kidney disease: Secondary | ICD-10-CM | POA: Diagnosis not present

## 2017-01-11 DIAGNOSIS — I129 Hypertensive chronic kidney disease with stage 1 through stage 4 chronic kidney disease, or unspecified chronic kidney disease: Secondary | ICD-10-CM | POA: Diagnosis not present

## 2017-01-11 DIAGNOSIS — N189 Chronic kidney disease, unspecified: Secondary | ICD-10-CM | POA: Diagnosis not present

## 2017-01-11 DIAGNOSIS — G629 Polyneuropathy, unspecified: Secondary | ICD-10-CM | POA: Diagnosis not present

## 2017-01-11 DIAGNOSIS — I2510 Atherosclerotic heart disease of native coronary artery without angina pectoris: Secondary | ICD-10-CM | POA: Diagnosis not present

## 2017-01-13 DIAGNOSIS — N189 Chronic kidney disease, unspecified: Secondary | ICD-10-CM | POA: Diagnosis not present

## 2017-01-13 DIAGNOSIS — I2510 Atherosclerotic heart disease of native coronary artery without angina pectoris: Secondary | ICD-10-CM | POA: Diagnosis not present

## 2017-01-13 DIAGNOSIS — I129 Hypertensive chronic kidney disease with stage 1 through stage 4 chronic kidney disease, or unspecified chronic kidney disease: Secondary | ICD-10-CM | POA: Diagnosis not present

## 2017-01-13 DIAGNOSIS — R531 Weakness: Secondary | ICD-10-CM | POA: Diagnosis not present

## 2017-01-13 DIAGNOSIS — I4891 Unspecified atrial fibrillation: Secondary | ICD-10-CM | POA: Diagnosis not present

## 2017-01-13 DIAGNOSIS — E1122 Type 2 diabetes mellitus with diabetic chronic kidney disease: Secondary | ICD-10-CM | POA: Diagnosis not present

## 2017-01-16 DIAGNOSIS — N189 Chronic kidney disease, unspecified: Secondary | ICD-10-CM | POA: Diagnosis not present

## 2017-01-16 DIAGNOSIS — I4891 Unspecified atrial fibrillation: Secondary | ICD-10-CM | POA: Diagnosis not present

## 2017-01-16 DIAGNOSIS — E1122 Type 2 diabetes mellitus with diabetic chronic kidney disease: Secondary | ICD-10-CM | POA: Diagnosis not present

## 2017-01-16 DIAGNOSIS — I2510 Atherosclerotic heart disease of native coronary artery without angina pectoris: Secondary | ICD-10-CM | POA: Diagnosis not present

## 2017-01-16 DIAGNOSIS — I129 Hypertensive chronic kidney disease with stage 1 through stage 4 chronic kidney disease, or unspecified chronic kidney disease: Secondary | ICD-10-CM | POA: Diagnosis not present

## 2017-01-16 DIAGNOSIS — R531 Weakness: Secondary | ICD-10-CM | POA: Diagnosis not present

## 2017-01-18 DIAGNOSIS — R531 Weakness: Secondary | ICD-10-CM | POA: Diagnosis not present

## 2017-01-18 DIAGNOSIS — N189 Chronic kidney disease, unspecified: Secondary | ICD-10-CM | POA: Diagnosis not present

## 2017-01-18 DIAGNOSIS — I4891 Unspecified atrial fibrillation: Secondary | ICD-10-CM | POA: Diagnosis not present

## 2017-01-18 DIAGNOSIS — I129 Hypertensive chronic kidney disease with stage 1 through stage 4 chronic kidney disease, or unspecified chronic kidney disease: Secondary | ICD-10-CM | POA: Diagnosis not present

## 2017-01-18 DIAGNOSIS — I2510 Atherosclerotic heart disease of native coronary artery without angina pectoris: Secondary | ICD-10-CM | POA: Diagnosis not present

## 2017-01-18 DIAGNOSIS — E1122 Type 2 diabetes mellitus with diabetic chronic kidney disease: Secondary | ICD-10-CM | POA: Diagnosis not present

## 2017-01-23 DIAGNOSIS — I2510 Atherosclerotic heart disease of native coronary artery without angina pectoris: Secondary | ICD-10-CM | POA: Diagnosis not present

## 2017-01-23 DIAGNOSIS — I129 Hypertensive chronic kidney disease with stage 1 through stage 4 chronic kidney disease, or unspecified chronic kidney disease: Secondary | ICD-10-CM | POA: Diagnosis not present

## 2017-01-23 DIAGNOSIS — I4891 Unspecified atrial fibrillation: Secondary | ICD-10-CM | POA: Diagnosis not present

## 2017-01-23 DIAGNOSIS — N189 Chronic kidney disease, unspecified: Secondary | ICD-10-CM | POA: Diagnosis not present

## 2017-01-23 DIAGNOSIS — R531 Weakness: Secondary | ICD-10-CM | POA: Diagnosis not present

## 2017-01-23 DIAGNOSIS — E1122 Type 2 diabetes mellitus with diabetic chronic kidney disease: Secondary | ICD-10-CM | POA: Diagnosis not present

## 2017-02-01 DIAGNOSIS — I4891 Unspecified atrial fibrillation: Secondary | ICD-10-CM | POA: Diagnosis not present

## 2017-02-01 DIAGNOSIS — I129 Hypertensive chronic kidney disease with stage 1 through stage 4 chronic kidney disease, or unspecified chronic kidney disease: Secondary | ICD-10-CM | POA: Diagnosis not present

## 2017-02-01 DIAGNOSIS — E1122 Type 2 diabetes mellitus with diabetic chronic kidney disease: Secondary | ICD-10-CM | POA: Diagnosis not present

## 2017-02-01 DIAGNOSIS — R531 Weakness: Secondary | ICD-10-CM | POA: Diagnosis not present

## 2017-02-01 DIAGNOSIS — N189 Chronic kidney disease, unspecified: Secondary | ICD-10-CM | POA: Diagnosis not present

## 2017-02-01 DIAGNOSIS — I2510 Atherosclerotic heart disease of native coronary artery without angina pectoris: Secondary | ICD-10-CM | POA: Diagnosis not present

## 2017-02-05 IMAGING — CR DG ABDOMEN 1V
1 series · 2 of 2 positions shown · non-contrast
Comparison: 11/20/2014 CTA

CLINICAL DATA: Low right abdominal pain for 2 days. Partial
hysterectomy and bladder surgery.

EXAM:
ABDOMEN - 1 VIEW

[Series 1: dg abd 1 view · 0.14mm/px · 2 of 2 slices shown]
[im 1/2]
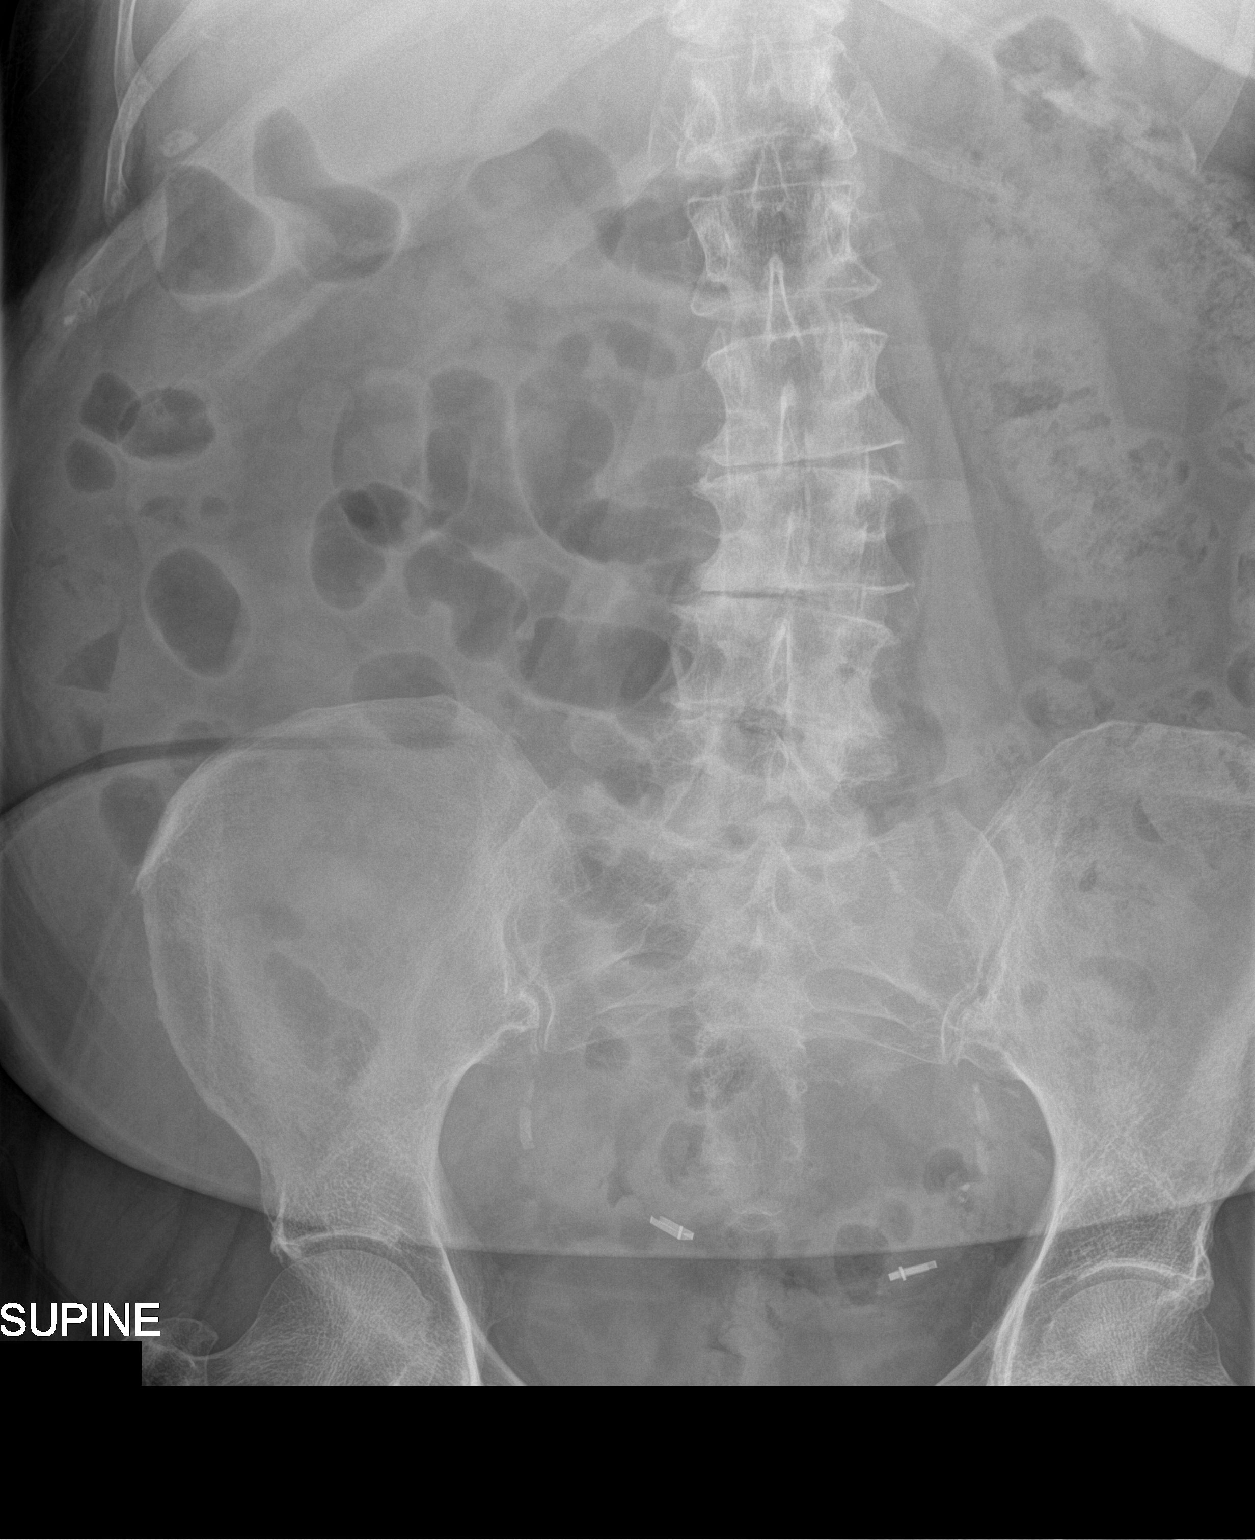
[im 2/2]
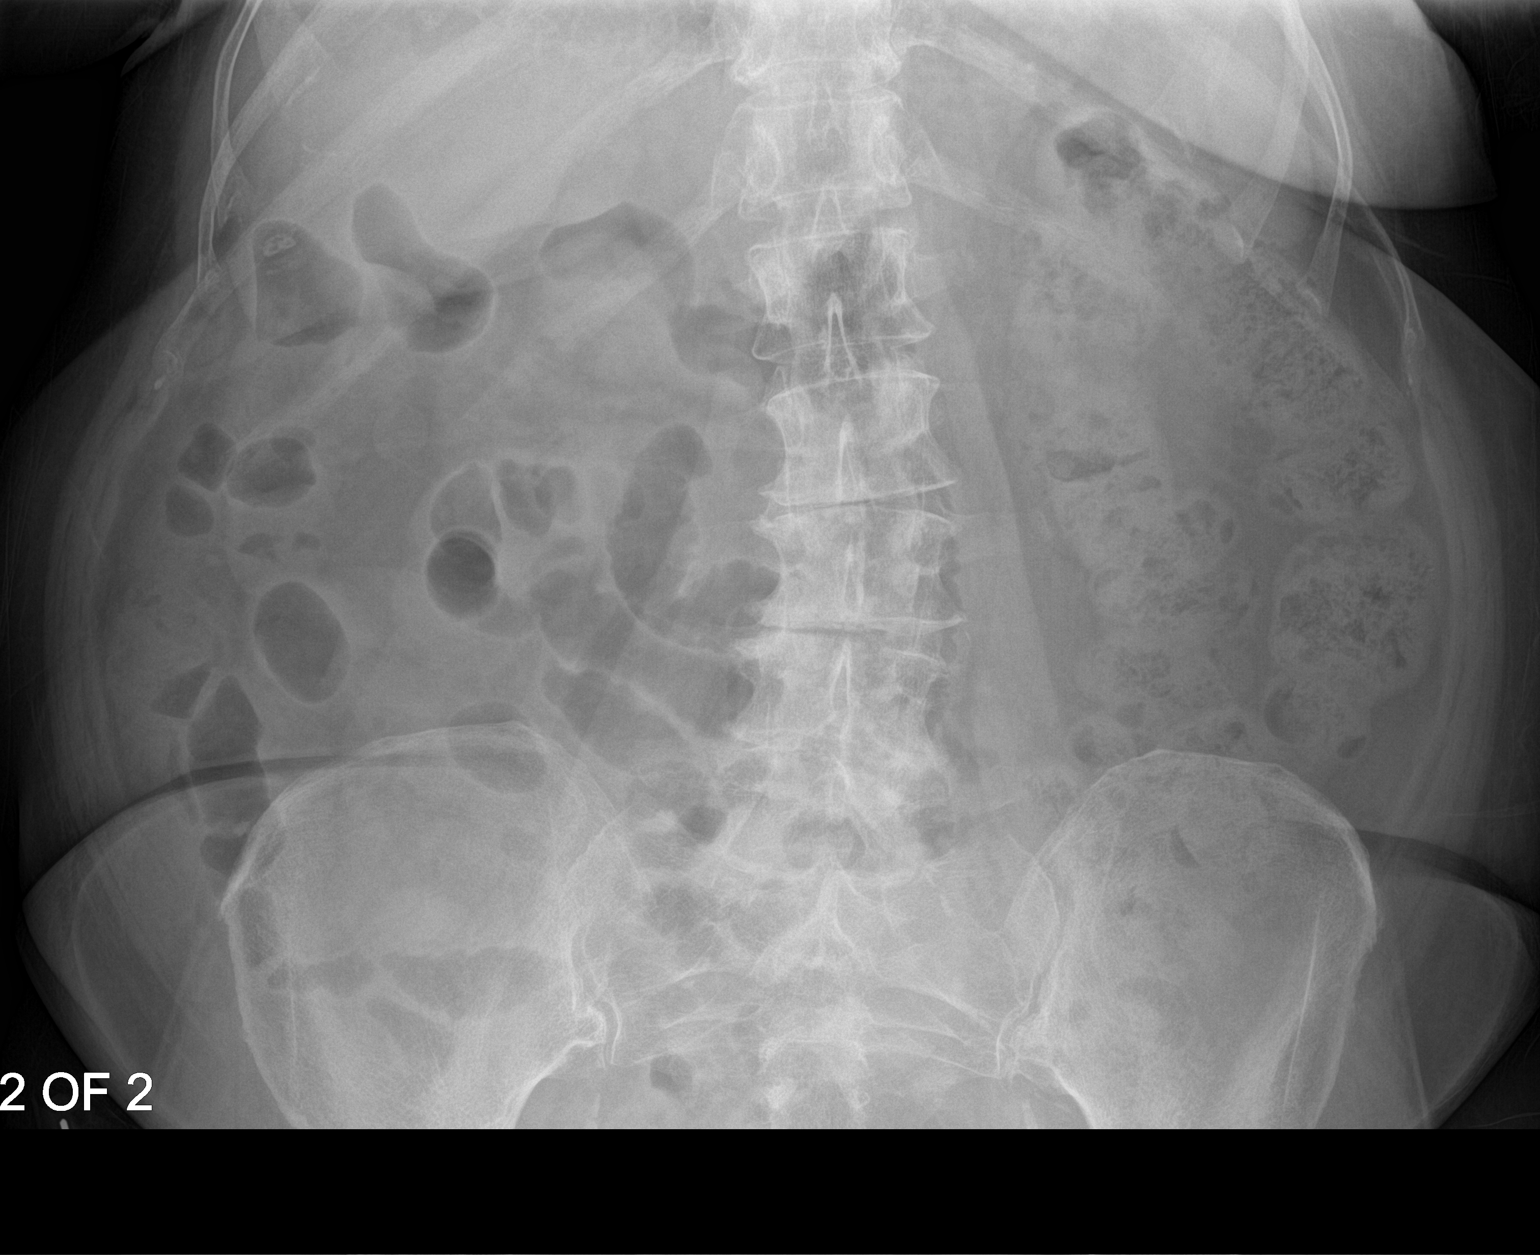

[2 of 2 positions shown; findings below may reference images not displayed]

FINDINGS: Two supine views. Convex left lumbar spine curvature.
Non-obstructive bowel gas pattern. No abnormal abdominal
calcifications. No appendicolith. Moderate amount of descending
colonic stool.
IMPRESSION: No acute findings.

Possible constipation.

## 2017-02-08 DIAGNOSIS — M25579 Pain in unspecified ankle and joints of unspecified foot: Secondary | ICD-10-CM | POA: Diagnosis not present

## 2017-02-08 DIAGNOSIS — M79604 Pain in right leg: Secondary | ICD-10-CM | POA: Diagnosis not present

## 2017-02-08 DIAGNOSIS — B354 Tinea corporis: Secondary | ICD-10-CM | POA: Diagnosis not present

## 2017-03-02 DIAGNOSIS — M79604 Pain in right leg: Secondary | ICD-10-CM | POA: Diagnosis not present

## 2017-03-08 DIAGNOSIS — R32 Unspecified urinary incontinence: Secondary | ICD-10-CM | POA: Diagnosis not present

## 2017-03-08 DIAGNOSIS — R51 Headache: Secondary | ICD-10-CM | POA: Diagnosis not present

## 2017-03-08 DIAGNOSIS — R109 Unspecified abdominal pain: Secondary | ICD-10-CM | POA: Diagnosis not present

## 2017-03-08 DIAGNOSIS — N390 Urinary tract infection, site not specified: Secondary | ICD-10-CM | POA: Diagnosis not present

## 2017-03-08 DIAGNOSIS — E876 Hypokalemia: Secondary | ICD-10-CM | POA: Diagnosis not present

## 2017-03-08 DIAGNOSIS — I252 Old myocardial infarction: Secondary | ICD-10-CM | POA: Diagnosis not present

## 2017-03-08 DIAGNOSIS — R001 Bradycardia, unspecified: Secondary | ICD-10-CM | POA: Diagnosis not present

## 2017-03-08 DIAGNOSIS — R55 Syncope and collapse: Secondary | ICD-10-CM | POA: Diagnosis not present

## 2017-03-08 DIAGNOSIS — E860 Dehydration: Secondary | ICD-10-CM | POA: Diagnosis not present

## 2017-03-09 DIAGNOSIS — I340 Nonrheumatic mitral (valve) insufficiency: Secondary | ICD-10-CM | POA: Diagnosis not present

## 2017-03-09 DIAGNOSIS — R51 Headache: Secondary | ICD-10-CM | POA: Diagnosis not present

## 2017-03-09 DIAGNOSIS — R001 Bradycardia, unspecified: Secondary | ICD-10-CM | POA: Diagnosis not present

## 2017-03-09 DIAGNOSIS — R55 Syncope and collapse: Secondary | ICD-10-CM | POA: Diagnosis not present

## 2017-03-09 DIAGNOSIS — E860 Dehydration: Secondary | ICD-10-CM | POA: Diagnosis not present

## 2017-03-09 DIAGNOSIS — R109 Unspecified abdominal pain: Secondary | ICD-10-CM | POA: Diagnosis not present

## 2017-03-09 DIAGNOSIS — E876 Hypokalemia: Secondary | ICD-10-CM | POA: Diagnosis not present

## 2017-03-09 DIAGNOSIS — N390 Urinary tract infection, site not specified: Secondary | ICD-10-CM | POA: Diagnosis not present

## 2017-03-09 DIAGNOSIS — R32 Unspecified urinary incontinence: Secondary | ICD-10-CM | POA: Diagnosis not present

## 2017-03-10 DIAGNOSIS — N390 Urinary tract infection, site not specified: Secondary | ICD-10-CM | POA: Diagnosis not present

## 2017-03-10 DIAGNOSIS — R55 Syncope and collapse: Secondary | ICD-10-CM | POA: Diagnosis not present

## 2017-03-10 DIAGNOSIS — R51 Headache: Secondary | ICD-10-CM | POA: Diagnosis not present

## 2017-03-10 DIAGNOSIS — E860 Dehydration: Secondary | ICD-10-CM | POA: Diagnosis not present

## 2017-03-11 DIAGNOSIS — E1142 Type 2 diabetes mellitus with diabetic polyneuropathy: Secondary | ICD-10-CM | POA: Diagnosis not present

## 2017-03-11 DIAGNOSIS — E1122 Type 2 diabetes mellitus with diabetic chronic kidney disease: Secondary | ICD-10-CM | POA: Diagnosis not present

## 2017-03-11 DIAGNOSIS — I4891 Unspecified atrial fibrillation: Secondary | ICD-10-CM | POA: Diagnosis not present

## 2017-03-11 DIAGNOSIS — G40909 Epilepsy, unspecified, not intractable, without status epilepticus: Secondary | ICD-10-CM | POA: Diagnosis not present

## 2017-03-11 DIAGNOSIS — N189 Chronic kidney disease, unspecified: Secondary | ICD-10-CM | POA: Diagnosis not present

## 2017-03-11 DIAGNOSIS — R531 Weakness: Secondary | ICD-10-CM | POA: Diagnosis not present

## 2017-03-13 DIAGNOSIS — G40909 Epilepsy, unspecified, not intractable, without status epilepticus: Secondary | ICD-10-CM | POA: Diagnosis not present

## 2017-03-13 DIAGNOSIS — I4891 Unspecified atrial fibrillation: Secondary | ICD-10-CM | POA: Diagnosis not present

## 2017-03-13 DIAGNOSIS — N189 Chronic kidney disease, unspecified: Secondary | ICD-10-CM | POA: Diagnosis not present

## 2017-03-13 DIAGNOSIS — R531 Weakness: Secondary | ICD-10-CM | POA: Diagnosis not present

## 2017-03-13 DIAGNOSIS — E1122 Type 2 diabetes mellitus with diabetic chronic kidney disease: Secondary | ICD-10-CM | POA: Diagnosis not present

## 2017-03-13 DIAGNOSIS — E1142 Type 2 diabetes mellitus with diabetic polyneuropathy: Secondary | ICD-10-CM | POA: Diagnosis not present

## 2017-03-14 DIAGNOSIS — N390 Urinary tract infection, site not specified: Secondary | ICD-10-CM | POA: Diagnosis not present

## 2017-03-14 DIAGNOSIS — F418 Other specified anxiety disorders: Secondary | ICD-10-CM | POA: Diagnosis not present

## 2017-03-14 DIAGNOSIS — L918 Other hypertrophic disorders of the skin: Secondary | ICD-10-CM | POA: Diagnosis not present

## 2017-03-14 DIAGNOSIS — R32 Unspecified urinary incontinence: Secondary | ICD-10-CM | POA: Diagnosis not present

## 2017-03-15 DIAGNOSIS — R531 Weakness: Secondary | ICD-10-CM | POA: Diagnosis not present

## 2017-03-15 DIAGNOSIS — N189 Chronic kidney disease, unspecified: Secondary | ICD-10-CM | POA: Diagnosis not present

## 2017-03-15 DIAGNOSIS — G40909 Epilepsy, unspecified, not intractable, without status epilepticus: Secondary | ICD-10-CM | POA: Diagnosis not present

## 2017-03-15 DIAGNOSIS — E1142 Type 2 diabetes mellitus with diabetic polyneuropathy: Secondary | ICD-10-CM | POA: Diagnosis not present

## 2017-03-15 DIAGNOSIS — E1122 Type 2 diabetes mellitus with diabetic chronic kidney disease: Secondary | ICD-10-CM | POA: Diagnosis not present

## 2017-03-15 DIAGNOSIS — I4891 Unspecified atrial fibrillation: Secondary | ICD-10-CM | POA: Diagnosis not present

## 2017-03-16 DIAGNOSIS — N189 Chronic kidney disease, unspecified: Secondary | ICD-10-CM | POA: Diagnosis not present

## 2017-03-16 DIAGNOSIS — E1122 Type 2 diabetes mellitus with diabetic chronic kidney disease: Secondary | ICD-10-CM | POA: Diagnosis not present

## 2017-03-16 DIAGNOSIS — R531 Weakness: Secondary | ICD-10-CM | POA: Diagnosis not present

## 2017-03-16 DIAGNOSIS — I4891 Unspecified atrial fibrillation: Secondary | ICD-10-CM | POA: Diagnosis not present

## 2017-03-16 DIAGNOSIS — G40909 Epilepsy, unspecified, not intractable, without status epilepticus: Secondary | ICD-10-CM | POA: Diagnosis not present

## 2017-03-16 DIAGNOSIS — E1142 Type 2 diabetes mellitus with diabetic polyneuropathy: Secondary | ICD-10-CM | POA: Diagnosis not present

## 2017-03-20 DIAGNOSIS — E1142 Type 2 diabetes mellitus with diabetic polyneuropathy: Secondary | ICD-10-CM | POA: Diagnosis not present

## 2017-03-20 DIAGNOSIS — E1122 Type 2 diabetes mellitus with diabetic chronic kidney disease: Secondary | ICD-10-CM | POA: Diagnosis not present

## 2017-03-20 DIAGNOSIS — N189 Chronic kidney disease, unspecified: Secondary | ICD-10-CM | POA: Diagnosis not present

## 2017-03-20 DIAGNOSIS — R531 Weakness: Secondary | ICD-10-CM | POA: Diagnosis not present

## 2017-03-20 DIAGNOSIS — G40909 Epilepsy, unspecified, not intractable, without status epilepticus: Secondary | ICD-10-CM | POA: Diagnosis not present

## 2017-03-20 DIAGNOSIS — I4891 Unspecified atrial fibrillation: Secondary | ICD-10-CM | POA: Diagnosis not present

## 2017-03-21 DIAGNOSIS — N189 Chronic kidney disease, unspecified: Secondary | ICD-10-CM | POA: Diagnosis not present

## 2017-03-21 DIAGNOSIS — E1142 Type 2 diabetes mellitus with diabetic polyneuropathy: Secondary | ICD-10-CM | POA: Diagnosis not present

## 2017-03-21 DIAGNOSIS — G40909 Epilepsy, unspecified, not intractable, without status epilepticus: Secondary | ICD-10-CM | POA: Diagnosis not present

## 2017-03-21 DIAGNOSIS — R531 Weakness: Secondary | ICD-10-CM | POA: Diagnosis not present

## 2017-03-21 DIAGNOSIS — E1122 Type 2 diabetes mellitus with diabetic chronic kidney disease: Secondary | ICD-10-CM | POA: Diagnosis not present

## 2017-03-21 DIAGNOSIS — I4891 Unspecified atrial fibrillation: Secondary | ICD-10-CM | POA: Diagnosis not present

## 2017-03-22 DIAGNOSIS — E1122 Type 2 diabetes mellitus with diabetic chronic kidney disease: Secondary | ICD-10-CM | POA: Diagnosis not present

## 2017-03-22 DIAGNOSIS — R531 Weakness: Secondary | ICD-10-CM | POA: Diagnosis not present

## 2017-03-22 DIAGNOSIS — E1142 Type 2 diabetes mellitus with diabetic polyneuropathy: Secondary | ICD-10-CM | POA: Diagnosis not present

## 2017-03-22 DIAGNOSIS — I4891 Unspecified atrial fibrillation: Secondary | ICD-10-CM | POA: Diagnosis not present

## 2017-03-22 DIAGNOSIS — G40909 Epilepsy, unspecified, not intractable, without status epilepticus: Secondary | ICD-10-CM | POA: Diagnosis not present

## 2017-03-22 DIAGNOSIS — N189 Chronic kidney disease, unspecified: Secondary | ICD-10-CM | POA: Diagnosis not present

## 2017-03-23 DIAGNOSIS — G40909 Epilepsy, unspecified, not intractable, without status epilepticus: Secondary | ICD-10-CM | POA: Diagnosis not present

## 2017-03-23 DIAGNOSIS — E1142 Type 2 diabetes mellitus with diabetic polyneuropathy: Secondary | ICD-10-CM | POA: Diagnosis not present

## 2017-03-23 DIAGNOSIS — R531 Weakness: Secondary | ICD-10-CM | POA: Diagnosis not present

## 2017-03-23 DIAGNOSIS — N189 Chronic kidney disease, unspecified: Secondary | ICD-10-CM | POA: Diagnosis not present

## 2017-03-23 DIAGNOSIS — I4891 Unspecified atrial fibrillation: Secondary | ICD-10-CM | POA: Diagnosis not present

## 2017-03-23 DIAGNOSIS — E1122 Type 2 diabetes mellitus with diabetic chronic kidney disease: Secondary | ICD-10-CM | POA: Diagnosis not present

## 2017-03-27 DIAGNOSIS — I4891 Unspecified atrial fibrillation: Secondary | ICD-10-CM | POA: Diagnosis not present

## 2017-03-27 DIAGNOSIS — E1122 Type 2 diabetes mellitus with diabetic chronic kidney disease: Secondary | ICD-10-CM | POA: Diagnosis not present

## 2017-03-27 DIAGNOSIS — E1142 Type 2 diabetes mellitus with diabetic polyneuropathy: Secondary | ICD-10-CM | POA: Diagnosis not present

## 2017-03-27 DIAGNOSIS — G40909 Epilepsy, unspecified, not intractable, without status epilepticus: Secondary | ICD-10-CM | POA: Diagnosis not present

## 2017-03-27 DIAGNOSIS — N189 Chronic kidney disease, unspecified: Secondary | ICD-10-CM | POA: Diagnosis not present

## 2017-03-27 DIAGNOSIS — R531 Weakness: Secondary | ICD-10-CM | POA: Diagnosis not present

## 2017-03-29 DIAGNOSIS — R531 Weakness: Secondary | ICD-10-CM | POA: Diagnosis not present

## 2017-03-29 DIAGNOSIS — E1142 Type 2 diabetes mellitus with diabetic polyneuropathy: Secondary | ICD-10-CM | POA: Diagnosis not present

## 2017-03-29 DIAGNOSIS — N189 Chronic kidney disease, unspecified: Secondary | ICD-10-CM | POA: Diagnosis not present

## 2017-03-29 DIAGNOSIS — G40909 Epilepsy, unspecified, not intractable, without status epilepticus: Secondary | ICD-10-CM | POA: Diagnosis not present

## 2017-03-29 DIAGNOSIS — E1122 Type 2 diabetes mellitus with diabetic chronic kidney disease: Secondary | ICD-10-CM | POA: Diagnosis not present

## 2017-03-29 DIAGNOSIS — I4891 Unspecified atrial fibrillation: Secondary | ICD-10-CM | POA: Diagnosis not present

## 2017-03-30 DIAGNOSIS — E1142 Type 2 diabetes mellitus with diabetic polyneuropathy: Secondary | ICD-10-CM | POA: Diagnosis not present

## 2017-03-30 DIAGNOSIS — E1122 Type 2 diabetes mellitus with diabetic chronic kidney disease: Secondary | ICD-10-CM | POA: Diagnosis not present

## 2017-03-30 DIAGNOSIS — I4891 Unspecified atrial fibrillation: Secondary | ICD-10-CM | POA: Diagnosis not present

## 2017-03-30 DIAGNOSIS — N189 Chronic kidney disease, unspecified: Secondary | ICD-10-CM | POA: Diagnosis not present

## 2017-03-30 DIAGNOSIS — R531 Weakness: Secondary | ICD-10-CM | POA: Diagnosis not present

## 2017-03-30 DIAGNOSIS — G40909 Epilepsy, unspecified, not intractable, without status epilepticus: Secondary | ICD-10-CM | POA: Diagnosis not present

## 2017-04-03 DIAGNOSIS — E1142 Type 2 diabetes mellitus with diabetic polyneuropathy: Secondary | ICD-10-CM | POA: Diagnosis not present

## 2017-04-03 DIAGNOSIS — I4891 Unspecified atrial fibrillation: Secondary | ICD-10-CM | POA: Diagnosis not present

## 2017-04-03 DIAGNOSIS — G40909 Epilepsy, unspecified, not intractable, without status epilepticus: Secondary | ICD-10-CM | POA: Diagnosis not present

## 2017-04-03 DIAGNOSIS — R531 Weakness: Secondary | ICD-10-CM | POA: Diagnosis not present

## 2017-04-03 DIAGNOSIS — N189 Chronic kidney disease, unspecified: Secondary | ICD-10-CM | POA: Diagnosis not present

## 2017-04-03 DIAGNOSIS — E1122 Type 2 diabetes mellitus with diabetic chronic kidney disease: Secondary | ICD-10-CM | POA: Diagnosis not present

## 2017-04-04 DIAGNOSIS — E1142 Type 2 diabetes mellitus with diabetic polyneuropathy: Secondary | ICD-10-CM | POA: Diagnosis not present

## 2017-04-04 DIAGNOSIS — G40909 Epilepsy, unspecified, not intractable, without status epilepticus: Secondary | ICD-10-CM | POA: Diagnosis not present

## 2017-04-04 DIAGNOSIS — I4891 Unspecified atrial fibrillation: Secondary | ICD-10-CM | POA: Diagnosis not present

## 2017-04-04 DIAGNOSIS — R531 Weakness: Secondary | ICD-10-CM | POA: Diagnosis not present

## 2017-04-04 DIAGNOSIS — E1122 Type 2 diabetes mellitus with diabetic chronic kidney disease: Secondary | ICD-10-CM | POA: Diagnosis not present

## 2017-04-04 DIAGNOSIS — N189 Chronic kidney disease, unspecified: Secondary | ICD-10-CM | POA: Diagnosis not present

## 2017-04-06 DIAGNOSIS — N189 Chronic kidney disease, unspecified: Secondary | ICD-10-CM | POA: Diagnosis not present

## 2017-04-06 DIAGNOSIS — G40909 Epilepsy, unspecified, not intractable, without status epilepticus: Secondary | ICD-10-CM | POA: Diagnosis not present

## 2017-04-06 DIAGNOSIS — I4891 Unspecified atrial fibrillation: Secondary | ICD-10-CM | POA: Diagnosis not present

## 2017-04-06 DIAGNOSIS — E1122 Type 2 diabetes mellitus with diabetic chronic kidney disease: Secondary | ICD-10-CM | POA: Diagnosis not present

## 2017-04-06 DIAGNOSIS — E1142 Type 2 diabetes mellitus with diabetic polyneuropathy: Secondary | ICD-10-CM | POA: Diagnosis not present

## 2017-04-06 DIAGNOSIS — R531 Weakness: Secondary | ICD-10-CM | POA: Diagnosis not present

## 2017-04-11 DIAGNOSIS — E1142 Type 2 diabetes mellitus with diabetic polyneuropathy: Secondary | ICD-10-CM | POA: Diagnosis not present

## 2017-04-11 DIAGNOSIS — R531 Weakness: Secondary | ICD-10-CM | POA: Diagnosis not present

## 2017-04-11 DIAGNOSIS — E1122 Type 2 diabetes mellitus with diabetic chronic kidney disease: Secondary | ICD-10-CM | POA: Diagnosis not present

## 2017-04-11 DIAGNOSIS — N189 Chronic kidney disease, unspecified: Secondary | ICD-10-CM | POA: Diagnosis not present

## 2017-04-11 DIAGNOSIS — I4891 Unspecified atrial fibrillation: Secondary | ICD-10-CM | POA: Diagnosis not present

## 2017-04-11 DIAGNOSIS — G40909 Epilepsy, unspecified, not intractable, without status epilepticus: Secondary | ICD-10-CM | POA: Diagnosis not present

## 2017-04-13 DIAGNOSIS — T887XXA Unspecified adverse effect of drug or medicament, initial encounter: Secondary | ICD-10-CM | POA: Diagnosis not present

## 2017-04-13 DIAGNOSIS — F10129 Alcohol abuse with intoxication, unspecified: Secondary | ICD-10-CM | POA: Diagnosis not present

## 2017-04-13 DIAGNOSIS — T43012A Poisoning by tricyclic antidepressants, intentional self-harm, initial encounter: Secondary | ICD-10-CM | POA: Diagnosis not present

## 2017-04-13 DIAGNOSIS — I10 Essential (primary) hypertension: Secondary | ICD-10-CM | POA: Diagnosis not present

## 2017-04-13 DIAGNOSIS — R531 Weakness: Secondary | ICD-10-CM | POA: Diagnosis not present

## 2017-04-13 DIAGNOSIS — I509 Heart failure, unspecified: Secondary | ICD-10-CM | POA: Diagnosis not present

## 2017-04-13 DIAGNOSIS — I110 Hypertensive heart disease with heart failure: Secondary | ICD-10-CM | POA: Diagnosis not present

## 2017-04-13 DIAGNOSIS — E119 Type 2 diabetes mellitus without complications: Secondary | ICD-10-CM | POA: Diagnosis not present

## 2017-04-13 DIAGNOSIS — N390 Urinary tract infection, site not specified: Secondary | ICD-10-CM | POA: Diagnosis not present

## 2017-04-13 DIAGNOSIS — T50902A Poisoning by unspecified drugs, medicaments and biological substances, intentional self-harm, initial encounter: Secondary | ICD-10-CM | POA: Diagnosis not present

## 2017-04-13 DIAGNOSIS — E785 Hyperlipidemia, unspecified: Secondary | ICD-10-CM | POA: Diagnosis not present

## 2017-04-13 DIAGNOSIS — F332 Major depressive disorder, recurrent severe without psychotic features: Secondary | ICD-10-CM | POA: Diagnosis not present

## 2017-07-10 DIAGNOSIS — G4700 Insomnia, unspecified: Secondary | ICD-10-CM | POA: Diagnosis not present

## 2017-07-10 DIAGNOSIS — R2681 Unsteadiness on feet: Secondary | ICD-10-CM | POA: Diagnosis not present

## 2017-07-10 DIAGNOSIS — F331 Major depressive disorder, recurrent, moderate: Secondary | ICD-10-CM | POA: Diagnosis not present

## 2017-07-10 DIAGNOSIS — L750 Bromhidrosis: Secondary | ICD-10-CM | POA: Diagnosis not present

## 2017-08-15 DIAGNOSIS — F339 Major depressive disorder, recurrent, unspecified: Secondary | ICD-10-CM | POA: Diagnosis not present

## 2017-08-15 DIAGNOSIS — R8290 Unspecified abnormal findings in urine: Secondary | ICD-10-CM | POA: Diagnosis not present

## 2017-08-15 DIAGNOSIS — Z23 Encounter for immunization: Secondary | ICD-10-CM | POA: Diagnosis not present

## 2017-08-16 DIAGNOSIS — Z23 Encounter for immunization: Secondary | ICD-10-CM | POA: Diagnosis not present

## 2017-08-16 DIAGNOSIS — R8290 Unspecified abnormal findings in urine: Secondary | ICD-10-CM | POA: Diagnosis not present
